# Patient Record
Sex: Female | Born: 1965 | Race: White | Hispanic: No | Marital: Married | Smoking: Never smoker
Health system: Southern US, Community
[De-identification: ages and names within clinical notes are randomized; demographics above are authoritative.]

## PROBLEM LIST (undated history)

## (undated) DIAGNOSIS — C801 Malignant (primary) neoplasm, unspecified: Secondary | ICD-10-CM

## (undated) DIAGNOSIS — I10 Essential (primary) hypertension: Secondary | ICD-10-CM

## (undated) DIAGNOSIS — E785 Hyperlipidemia, unspecified: Secondary | ICD-10-CM

## (undated) DIAGNOSIS — N939 Abnormal uterine and vaginal bleeding, unspecified: Secondary | ICD-10-CM

## (undated) DIAGNOSIS — D649 Anemia, unspecified: Secondary | ICD-10-CM

## (undated) DIAGNOSIS — N840 Polyp of corpus uteri: Secondary | ICD-10-CM

## (undated) HISTORY — DX: Essential (primary) hypertension: I10

## (undated) HISTORY — PX: BREAST SURGERY: SHX581

---

## 2001-11-08 ENCOUNTER — Other Ambulatory Visit: Admission: RE | Admit: 2001-11-08 | Discharge: 2001-11-08 | Payer: Self-pay | Admitting: Gynecology

## 2010-04-18 ENCOUNTER — Encounter: Payer: Self-pay | Admitting: General Surgery

## 2012-04-18 ENCOUNTER — Other Ambulatory Visit (HOSPITAL_COMMUNITY): Payer: Self-pay | Admitting: General Surgery

## 2012-04-18 DIAGNOSIS — N6320 Unspecified lump in the left breast, unspecified quadrant: Secondary | ICD-10-CM

## 2012-04-18 DIAGNOSIS — N6310 Unspecified lump in the right breast, unspecified quadrant: Secondary | ICD-10-CM

## 2012-04-25 ENCOUNTER — Other Ambulatory Visit (HOSPITAL_COMMUNITY): Payer: Self-pay | Admitting: General Surgery

## 2012-04-25 ENCOUNTER — Ambulatory Visit (HOSPITAL_COMMUNITY)
Admission: RE | Admit: 2012-04-25 | Discharge: 2012-04-25 | Disposition: A | Discharge status: Home / Self Care | Payer: Self-pay | Source: Ambulatory Visit | Attending: General Surgery | Admitting: General Surgery

## 2012-04-25 DIAGNOSIS — N6310 Unspecified lump in the right breast, unspecified quadrant: Secondary | ICD-10-CM

## 2012-04-25 DIAGNOSIS — N6320 Unspecified lump in the left breast, unspecified quadrant: Secondary | ICD-10-CM

## 2012-04-25 DIAGNOSIS — N63 Unspecified lump in unspecified breast: Secondary | ICD-10-CM | POA: Insufficient documentation

## 2012-04-25 DIAGNOSIS — N6009 Solitary cyst of unspecified breast: Secondary | ICD-10-CM | POA: Insufficient documentation

## 2012-06-20 ENCOUNTER — Other Ambulatory Visit (HOSPITAL_COMMUNITY)
Admission: RE | Admit: 2012-06-20 | Discharge: 2012-06-20 | Disposition: A | Discharge status: Home / Self Care | Payer: Self-pay | Source: Ambulatory Visit | Attending: General Surgery | Admitting: General Surgery

## 2012-06-20 ENCOUNTER — Other Ambulatory Visit: Payer: Self-pay | Admitting: General Surgery

## 2012-06-20 DIAGNOSIS — N6009 Solitary cyst of unspecified breast: Secondary | ICD-10-CM | POA: Insufficient documentation

## 2012-08-21 ENCOUNTER — Other Ambulatory Visit (HOSPITAL_COMMUNITY)
Admission: RE | Admit: 2012-08-21 | Discharge: 2012-08-21 | Disposition: A | Discharge status: Home / Self Care | Payer: Self-pay | Source: Ambulatory Visit | Attending: General Surgery | Admitting: General Surgery

## 2012-08-21 DIAGNOSIS — N6009 Solitary cyst of unspecified breast: Secondary | ICD-10-CM | POA: Insufficient documentation

## 2014-06-03 ENCOUNTER — Other Ambulatory Visit (HOSPITAL_COMMUNITY): Payer: Self-pay | Admitting: General Surgery

## 2014-06-03 DIAGNOSIS — R229 Localized swelling, mass and lump, unspecified: Principal | ICD-10-CM

## 2014-06-03 DIAGNOSIS — IMO0002 Reserved for concepts with insufficient information to code with codable children: Secondary | ICD-10-CM

## 2014-06-17 ENCOUNTER — Ambulatory Visit (HOSPITAL_COMMUNITY)
Admission: RE | Admit: 2014-06-17 | Discharge: 2014-06-17 | Disposition: A | Discharge status: Home / Self Care | Payer: Self-pay | Source: Ambulatory Visit | Attending: General Surgery | Admitting: General Surgery

## 2014-06-17 ENCOUNTER — Other Ambulatory Visit (HOSPITAL_COMMUNITY): Payer: Self-pay | Admitting: General Surgery

## 2014-06-17 DIAGNOSIS — IMO0002 Reserved for concepts with insufficient information to code with codable children: Secondary | ICD-10-CM

## 2014-06-17 DIAGNOSIS — R229 Localized swelling, mass and lump, unspecified: Principal | ICD-10-CM

## 2014-06-17 DIAGNOSIS — N63 Unspecified lump in breast: Secondary | ICD-10-CM | POA: Insufficient documentation

## 2014-11-03 ENCOUNTER — Ambulatory Visit (HOSPITAL_COMMUNITY)
Admission: RE | Admit: 2014-11-03 | Discharge: 2014-11-03 | Disposition: A | Discharge status: Home / Self Care | Payer: Self-pay | Source: Ambulatory Visit | Attending: Obstetrics and Gynecology | Admitting: Obstetrics and Gynecology

## 2014-11-03 ENCOUNTER — Encounter (HOSPITAL_COMMUNITY): Payer: Self-pay

## 2014-11-03 VITALS — BP 132/84 | Temp 97.4°F | Ht 62.0 in | Wt 172.0 lb

## 2014-11-03 DIAGNOSIS — Z01419 Encounter for gynecological examination (general) (routine) without abnormal findings: Secondary | ICD-10-CM

## 2014-11-03 HISTORY — DX: Anemia, unspecified: D64.9

## 2014-11-03 NOTE — Progress Notes (Signed)
CLINIC:   Breast & Cervical Cancer Control Program (BCCCP) Clinic  REASON FOR VISIT: Well-woman exam with routine gynecological exam  HISTORY OF PRESENT ILLNESS:   Ms. Bonnie Dorsey is a 49 y.o. female who presents to the [facility] today for clinical breast exam and routine gynecological exam.  History obtained through interpreter. She reports bilateral nodularity that comes and goes with her periods. Denies discrete masses. Her last mammogram was in March 2016 and was benign.  Her last pap smear was 10 years ago and was normal. She has no history of abnormal pap smears.    REVIEW OF SYSTEMS:   Reports irregular periods. Denies breast pain, nodularity, skin changes, nipple inversion, or nipple discharge bilaterally.  Denies any pelvic pain, pressure.    ALLERGIES: No Known Allergies   CURRENT MEDICATIONS:   No current outpatient prescriptions on file prior to encounter.   No current facility-administered medications on file prior to encounter.   PHYSICAL EXAM:   Filed Vitals:   11/03/14 1124  BP: 132/84  Temp: 97.4 F (36.3 C)   General: Well-nourished, well-appearing female in no acute distress.  She is accompanied by an interpreter in clinic today.  Bonnie Infante, LPN was present during physical exam for this patient.   Breasts: Bilateral breasts exposed and observed with patient standing (arms at side, arms on hips, arms on hips flexed forward, and arms over head).  No gross abnormalities including breast skin puckering or dimpling noted on observation.  Breasts symmetrical without evidence of skin redness, thickening, or peau d'orange appearance. No nipple retraction or nipple discharge noted bilaterally.  No breast nodularity palpated in bilateral breasts.  Axillary lymph nodes: No axillary lymphadenopathy bilaterally.    GU:   -External genitalia: No lesions, swelling, or discharge. Even hair distribution as expected.   -Vagina: Pink, moist. Difficulty visualizing the vaginal  canal due to blood.   -Cervix: Cervix pink. Cervical os patent. Blood noted.   -Uterus: Bimanual exam demonstrates no uterine mass or tenderness on palpation. Uterus in normal position and normal size.   -Adnexae: Bimanual exam demonstrates no ovarian masses or tenderness on palpation.   -Rectovaginal: No lesions noted to rectum. Rectal tone intact.  No masses or nodularity palpated by bimanual rectovaginal exam.     ASSESSMENT & PLAN:   1. Breast cancer screening: Ms. Pizano has no palpable breast abnormalities on her clinical breast exam today.  She will have a repeat screening mammogram in March 2017.  She was given instructions and educational materials regarding breast self-awareness. Ms. Nolting is aware of this plan and agrees with it.   2. Cervical cancer screening: Ms. Yurick had a lot of blood due to her period on exam today.  A pap smear was completed today per protocol.  She tolerated the procedure without complaints.  She will be contacted by one of our [place] in the next few weeks to review the results of the pap smear with the patient.    Ms. Hecker was encouraged to ask questions and all questions were answered to her satisfaction.      Bonnie Bussing, DNP, AGPCNP-BC, [place]   [phone]

## 2014-11-04 LAB — CYTOLOGY - PAP

## 2014-11-06 ENCOUNTER — Other Ambulatory Visit (HOSPITAL_COMMUNITY): Payer: Self-pay | Admitting: *Deleted

## 2014-11-06 ENCOUNTER — Telehealth (HOSPITAL_COMMUNITY): Payer: Self-pay | Admitting: *Deleted

## 2014-11-06 NOTE — Telephone Encounter (Signed)
Telephoned patient at home # and no voice mail box set up. Used interpreter Lavon Paganini.

## 2014-11-07 ENCOUNTER — Telehealth (HOSPITAL_COMMUNITY): Payer: Self-pay | Admitting: *Deleted

## 2014-11-07 NOTE — Telephone Encounter (Signed)
Patient returned call and advised patient of normal pap smear results. Negative HPV results. Next pap smear due in 5 years. Patient voiced understanding. Used interpreter Lavon Paganini.

## 2014-11-13 ENCOUNTER — Other Ambulatory Visit: Payer: Self-pay

## 2014-11-13 ENCOUNTER — Ambulatory Visit (HOSPITAL_BASED_OUTPATIENT_CLINIC_OR_DEPARTMENT_OTHER): Payer: Self-pay

## 2014-11-13 VITALS — BP 120/80 | HR 78 | Temp 98.3°F | Resp 14 | Ht <= 58 in | Wt 173.3 lb

## 2014-11-13 DIAGNOSIS — Z0000 Encounter for general adult medical examination without abnormal findings: Secondary | ICD-10-CM

## 2014-11-13 LAB — LIPID PANEL
CHOL/HDL RATIO: 4.1 ratio (ref <=5.0)
Cholesterol: 134 mg/dL (ref 125–200)
HDL: 33 mg/dL — AB (ref >=46)
LDL Cholesterol: 50 mg/dL (ref <130)
Triglycerides: 254 mg/dL — ABNORMAL HIGH (ref <150)
VLDL: 51 mg/dL — ABNORMAL HIGH (ref <30)

## 2014-11-13 LAB — HEMOGLOBIN A1C
Hgb A1c MFr Bld: 5.3 % (ref <5.7)
MEAN PLASMA GLUCOSE: 105 mg/dL (ref <117)

## 2014-11-13 LAB — GLUCOSE (CC13): Glucose: 97 mg/dl (ref 70–140)

## 2014-11-13 NOTE — Patient Instructions (Signed)
Discussed what will result from lab work and vital signs. Will write lab results on number sheet when results called. Verbalized understanding.

## 2014-11-13 NOTE — Progress Notes (Signed)
Patient is a new patient to the [facility] program and is currently a BCCCP patient effective 11/03/2014 with interpreter Lavon Paganini.   Clinical Measurements: Patient is 4 ft. 10 inches, weight 173.3 lbs.   Medical History: Patient has no history of high cholesterol. Patient does not have a history of hypertension or diabetes. Per patient no diagnosed history of coronary heart disease, heart attack, heart failure, stroke/TIA, vascular disease or congenital heart defects.   Blood Pressure, Self-measurement: Patient states has no reason to check Blood pressure.  Nutrition Assessment: Patient stated that eats 1 fruit every day. Patient states she eats 1 serving of vegetables a day. Per patient states does eat 3 or more ounces of whole grains daily. Patient stated doesn't eat two or more servings of fish weekly. Patient states she does not drink more than 36 ounces or 450 calories of beverages with added sugars weekly. Patient stated she does not watch her salt intake. Marland Kitchen  Physical Activity Assessment: Patient stated that takes care of two small children and does not do much exercise. Per patient states rides stationary bike about 15 minutes twice a day and does domestic chores for around 150 minutes a week and does not do any vigorous exercise.  Smoking Status: Patient has never smoked and is not exposed to smoke.  Quality of Life Assessment: In assessing patient's quality of life she stated that out of the past 30 days that she has felt her health is good all of them. Patient also stated that in the past 30 days that her mental health was good including stress, depression and problems with emotions for all days. Patient did state that out of the past 30 days she felt her physical or mental health had not kept her from doing her usual activities including self-care, work or recreation.   Plan: Lab work will be done today including a lipid panel, blood glucose, and Hgb A1C. Will call lab results when  they are finished. Will discuss risk reduction counseling when call results.

## 2015-01-01 ENCOUNTER — Telehealth: Payer: Self-pay

## 2015-01-01 NOTE — Telephone Encounter (Addendum)
Patient stated that had lost 3 lbs and was following information she was given at risk reduction counseling and does not want health coaching.

## 2015-02-11 NOTE — Addendum Note (Signed)
Encounter addended by: Loletta Parish, RN on: 02/11/2015  4:47 PM<BR>     Documentation filed: Charges VN

## 2017-01-02 ENCOUNTER — Encounter (HOSPITAL_COMMUNITY): Payer: Self-pay

## 2017-09-04 ENCOUNTER — Encounter (HOSPITAL_COMMUNITY): Payer: Self-pay

## 2017-09-07 ENCOUNTER — Encounter (HOSPITAL_COMMUNITY): Payer: Self-pay

## 2017-09-07 ENCOUNTER — Ambulatory Visit (HOSPITAL_COMMUNITY)
Admission: RE | Admit: 2017-09-07 | Discharge: 2017-09-07 | Disposition: A | Discharge status: Home / Self Care | Payer: Self-pay | Source: Ambulatory Visit | Attending: Obstetrics and Gynecology | Admitting: Obstetrics and Gynecology

## 2017-09-07 VITALS — BP 112/70

## 2017-09-07 DIAGNOSIS — Z1239 Encounter for other screening for malignant neoplasm of breast: Secondary | ICD-10-CM

## 2017-09-07 DIAGNOSIS — N6310 Unspecified lump in the right breast, unspecified quadrant: Secondary | ICD-10-CM

## 2017-09-07 DIAGNOSIS — N6320 Unspecified lump in the left breast, unspecified quadrant: Secondary | ICD-10-CM

## 2017-09-07 HISTORY — DX: Hyperlipidemia, unspecified: E78.5

## 2017-09-07 NOTE — Progress Notes (Signed)
Patient referred to BCCCP by [facility] due to recommending a right breast biopsy. Diagnostic mammogram and right breast ultrasound completed 09/04/2017.  Patient complained of a left breast lump x 2 months that is painful to the touch. Patient rates the pain at a 3 out of 10.  Patient complained of a cluster of lumps within the right breast x a few weeks and pain at times over the past month. Patient rates the pain at a 4 out of 10.  Pap Smear: Pap smear not completed today. Last Pap smear was 11/03/2014 at [facility] and normal with endometrial cells present consistent with menstrual period and negative HPV. Per patient has no history of an abnormal Pap smear. Last Pap smear result is in Epic.  Physical exam: Breasts  Breasts symmetrical. No skin abnormalities bilateral breasts. No nipple retraction bilateral breasts. No nipple discharge bilateral breasts. No lymphadenopathy. Palpated a lump within the left breast under areola. Palpated a cluster of lumps under the right breast areola. Complaints of tenderness when palpated lumps. Referred patient to [facility] for a right breast biopsy per recommendation. Appointment scheduled for Tuesday, September 12, 2017 at 0830.        Pelvic/Bimanual No Pap smear completed today since last Pap smear and HPV typing was 11/03/2014. Pap smear not indicated per BCCCP guidelines.   Smoking History: Patient has never smoked.  Patient Navigation: Patient education provided. Access to services provided for patient through [facility] program. Spanish interpreter provided.   Colorectal Cancer Screening: Per patient has never had a colonoscopy completed. No complaints today. FIT Test given to patient to complete and return to BCCCP.  Breast and Cervical Cancer Risk Assessment: Patient has a family history of her sister having breast cancer. Patient has no known genetic mutations or history of radiation treatment to the chest before age 65. Patient has  no history of cervical dysplasia, immunocompromised, or DES exposure in-utero. Patient has a 5-year risk for breast cancer at 1.3% and a lifetime risk at 10.1%.  Used Spanish interpreter [employer] from [place].

## 2017-09-07 NOTE — Patient Instructions (Signed)
Explained breast self awareness with Bonnie Dorsey. Patient did not need a Pap smear today due to last Pap smear and HPV typing was 11/03/2014. Let her know BCCCP will cover Pap smears and HPV typing every 5 years unless has a history of abnormal Pap smears. Referred patient to [facility] for a right breast biopsy per recommendation. Appointment scheduled for Tuesday, September 12, 2017 at 0830. Bonnie Dorsey verbalized understanding.  Gean Larose, Arvil Chaco, RN 3:26 PM

## 2017-09-12 ENCOUNTER — Other Ambulatory Visit: Payer: Self-pay | Admitting: Radiology

## 2017-09-15 ENCOUNTER — Encounter (HOSPITAL_COMMUNITY): Payer: Self-pay | Admitting: *Deleted

## 2017-09-20 ENCOUNTER — Other Ambulatory Visit: Payer: Self-pay | Admitting: Radiology

## 2017-10-03 ENCOUNTER — Encounter (HOSPITAL_COMMUNITY): Payer: Self-pay | Admitting: *Deleted

## 2017-10-04 ENCOUNTER — Other Ambulatory Visit: Payer: Self-pay | Admitting: General Surgery

## 2017-10-04 DIAGNOSIS — N6091 Unspecified benign mammary dysplasia of right breast: Secondary | ICD-10-CM

## 2017-10-04 DIAGNOSIS — D0501 Lobular carcinoma in situ of right breast: Secondary | ICD-10-CM

## 2017-10-11 ENCOUNTER — Other Ambulatory Visit: Payer: Self-pay

## 2017-10-11 ENCOUNTER — Encounter (HOSPITAL_BASED_OUTPATIENT_CLINIC_OR_DEPARTMENT_OTHER): Payer: Self-pay | Admitting: *Deleted

## 2017-10-16 ENCOUNTER — Encounter (HOSPITAL_BASED_OUTPATIENT_CLINIC_OR_DEPARTMENT_OTHER): Payer: Self-pay | Admitting: *Deleted

## 2017-10-16 NOTE — Progress Notes (Signed)
Ensure pre surgery drink given with instructions to complete by 0530 dos, surgical soap given with instructions, pt verbalized understanding. 

## 2017-10-17 ENCOUNTER — Ambulatory Visit (HOSPITAL_BASED_OUTPATIENT_CLINIC_OR_DEPARTMENT_OTHER)
Admission: RE | Admit: 2017-10-17 | Discharge: 2017-10-17 | Disposition: A | Discharge status: Home / Self Care | Payer: Self-pay | Source: Ambulatory Visit | Attending: General Surgery | Admitting: General Surgery

## 2017-10-17 ENCOUNTER — Ambulatory Visit (HOSPITAL_BASED_OUTPATIENT_CLINIC_OR_DEPARTMENT_OTHER): Payer: Self-pay | Admitting: Anesthesiology

## 2017-10-17 ENCOUNTER — Encounter (HOSPITAL_BASED_OUTPATIENT_CLINIC_OR_DEPARTMENT_OTHER): Payer: Self-pay

## 2017-10-17 ENCOUNTER — Encounter (HOSPITAL_BASED_OUTPATIENT_CLINIC_OR_DEPARTMENT_OTHER)
Admission: RE | Disposition: A | Discharge status: Home / Self Care | Payer: Self-pay | Source: Ambulatory Visit | Attending: General Surgery

## 2017-10-17 ENCOUNTER — Other Ambulatory Visit: Payer: Self-pay

## 2017-10-17 DIAGNOSIS — Z79899 Other long term (current) drug therapy: Secondary | ICD-10-CM | POA: Insufficient documentation

## 2017-10-17 DIAGNOSIS — E7800 Pure hypercholesterolemia, unspecified: Secondary | ICD-10-CM | POA: Insufficient documentation

## 2017-10-17 DIAGNOSIS — N6091 Unspecified benign mammary dysplasia of right breast: Secondary | ICD-10-CM | POA: Insufficient documentation

## 2017-10-17 DIAGNOSIS — D0501 Lobular carcinoma in situ of right breast: Secondary | ICD-10-CM | POA: Insufficient documentation

## 2017-10-17 HISTORY — DX: Malignant (primary) neoplasm, unspecified: C80.1

## 2017-10-17 HISTORY — PX: RADIOACTIVE SEED GUIDED EXCISIONAL BREAST BIOPSY: SHX6490

## 2017-10-17 LAB — POCT PREGNANCY, URINE: Preg Test, Ur: NEGATIVE

## 2017-10-17 SURGERY — RADIOACTIVE SEED GUIDED BREAST BIOPSY
Anesthesia: General | Site: Breast | Laterality: Right

## 2017-10-17 MED ORDER — PROPOFOL 10 MG/ML IV BOLUS: INTRAVENOUS | Status: DC | PRN

## 2017-10-17 MED ORDER — EPHEDRINE SULFATE 50 MG/ML IJ SOLN
INTRAMUSCULAR | Status: DC | PRN
  Administered 2017-10-17 (×2): 10 mg via INTRAVENOUS

## 2017-10-17 MED ORDER — DEXAMETHASONE SODIUM PHOSPHATE 10 MG/ML IJ SOLN
INTRAMUSCULAR | Status: AC
  Filled 2017-10-17: qty 1

## 2017-10-17 MED ORDER — CEFAZOLIN SODIUM-DEXTROSE 2-4 GM/100ML-% IV SOLN
2.0000 g | INTRAVENOUS | Status: AC
  Administered 2017-10-17: 2 g via INTRAVENOUS

## 2017-10-17 MED ORDER — MIDAZOLAM HCL 2 MG/2ML IJ SOLN
INTRAMUSCULAR | Status: AC
  Filled 2017-10-17: qty 2

## 2017-10-17 MED ORDER — ONDANSETRON HCL 4 MG/2ML IJ SOLN
INTRAMUSCULAR | Status: DC | PRN
  Administered 2017-10-17: 4 mg via INTRAVENOUS

## 2017-10-17 MED ORDER — CEFAZOLIN SODIUM-DEXTROSE 2-4 GM/100ML-% IV SOLN
INTRAVENOUS | Status: AC
Start: 2017-10-17 — End: ?
  Filled 2017-10-17: qty 100

## 2017-10-17 MED ORDER — ONDANSETRON HCL 4 MG/2ML IJ SOLN
INTRAMUSCULAR | Status: AC
  Filled 2017-10-17: qty 2

## 2017-10-17 MED ORDER — LACTATED RINGERS IV SOLN
INTRAVENOUS | Status: DC
  Administered 2017-10-17 (×2): via INTRAVENOUS

## 2017-10-17 MED ORDER — TRAMADOL HCL 50 MG PO TABS: 50.0000 mg | ORAL_TABLET | Freq: Four times a day (QID) | ORAL | 0 refills | Status: DC | PRN

## 2017-10-17 MED ORDER — SCOPOLAMINE 1 MG/3DAYS TD PT72: 1.0000 | MEDICATED_PATCH | Freq: Once | TRANSDERMAL | Status: DC | PRN

## 2017-10-17 MED ORDER — CHLORHEXIDINE GLUCONATE CLOTH 2 % EX PADS: 6.0000 | MEDICATED_PAD | Freq: Once | CUTANEOUS | Status: DC

## 2017-10-17 MED ORDER — GABAPENTIN 100 MG PO CAPS
100.0000 mg | ORAL_CAPSULE | ORAL | Status: AC
  Administered 2017-10-17: 100 mg via ORAL

## 2017-10-17 MED ORDER — BUPIVACAINE HCL (PF) 0.25 % IJ SOLN
INTRAMUSCULAR | Status: AC
  Filled 2017-10-17: qty 30

## 2017-10-17 MED ORDER — ACETAMINOPHEN 500 MG PO TABS
1000.0000 mg | ORAL_TABLET | ORAL | Status: AC
  Administered 2017-10-17: 1000 mg via ORAL

## 2017-10-17 MED ORDER — FENTANYL CITRATE (PF) 100 MCG/2ML IJ SOLN
INTRAMUSCULAR | Status: AC
  Filled 2017-10-17: qty 2

## 2017-10-17 MED ORDER — CELECOXIB 200 MG PO CAPS
ORAL_CAPSULE | ORAL | Status: AC
Start: 2017-10-17 — End: ?
  Filled 2017-10-17: qty 2

## 2017-10-17 MED ORDER — FENTANYL CITRATE (PF) 100 MCG/2ML IJ SOLN
50.0000 ug | INTRAMUSCULAR | Status: DC | PRN
  Administered 2017-10-17: 100 ug via INTRAVENOUS

## 2017-10-17 MED ORDER — BUPIVACAINE HCL (PF) 0.25 % IJ SOLN
INTRAMUSCULAR | Status: DC | PRN
  Administered 2017-10-17: 8 mL

## 2017-10-17 MED ORDER — OXYCODONE HCL 5 MG/5ML PO SOLN: 5.0000 mg | Freq: Once | ORAL | Status: DC | PRN

## 2017-10-17 MED ORDER — LIDOCAINE HCL (CARDIAC) PF 100 MG/5ML IV SOSY
PREFILLED_SYRINGE | INTRAVENOUS | Status: AC
  Filled 2017-10-17: qty 5

## 2017-10-17 MED ORDER — LIDOCAINE HCL (CARDIAC) PF 100 MG/5ML IV SOSY
PREFILLED_SYRINGE | INTRAVENOUS | Status: DC | PRN
  Administered 2017-10-17: 70 mg via INTRAVENOUS

## 2017-10-17 MED ORDER — DEXAMETHASONE SODIUM PHOSPHATE 4 MG/ML IJ SOLN
INTRAMUSCULAR | Status: DC | PRN
  Administered 2017-10-17: 10 mg via INTRAVENOUS

## 2017-10-17 MED ORDER — ENSURE PRE-SURGERY PO LIQD: 296.0000 mL | Freq: Once | ORAL | Status: DC

## 2017-10-17 MED ORDER — CELECOXIB 400 MG PO CAPS
400.0000 mg | ORAL_CAPSULE | ORAL | Status: AC
  Administered 2017-10-17: 400 mg via ORAL

## 2017-10-17 MED ORDER — OXYCODONE HCL 5 MG PO TABS: 5.0000 mg | ORAL_TABLET | Freq: Once | ORAL | Status: DC | PRN

## 2017-10-17 MED ORDER — FENTANYL CITRATE (PF) 100 MCG/2ML IJ SOLN
25.0000 ug | INTRAMUSCULAR | Status: DC | PRN
Start: 2017-10-17 — End: 2017-10-17

## 2017-10-17 MED ORDER — ONDANSETRON HCL 4 MG/2ML IJ SOLN: 4.0000 mg | Freq: Four times a day (QID) | INTRAMUSCULAR | Status: DC | PRN

## 2017-10-17 MED ORDER — GABAPENTIN 100 MG PO CAPS
ORAL_CAPSULE | ORAL | Status: AC
  Filled 2017-10-17: qty 1

## 2017-10-17 MED ORDER — PROPOFOL 10 MG/ML IV BOLUS
INTRAVENOUS | Status: AC
  Filled 2017-10-17: qty 20

## 2017-10-17 MED ORDER — ACETAMINOPHEN 500 MG PO TABS
ORAL_TABLET | ORAL | Status: AC
  Filled 2017-10-17: qty 2

## 2017-10-17 MED ORDER — MIDAZOLAM HCL 2 MG/2ML IJ SOLN
1.0000 mg | INTRAMUSCULAR | Status: DC | PRN
  Administered 2017-10-17: 2 mg via INTRAVENOUS

## 2017-10-17 MED ORDER — PROPOFOL 10 MG/ML IV BOLUS
INTRAVENOUS | Status: DC | PRN
  Administered 2017-10-17: 180 mg via INTRAVENOUS

## 2017-10-17 SURGICAL SUPPLY — 62 items
ADH SKN CLS APL DERMABOND .7 (GAUZE/BANDAGES/DRESSINGS) ×1
APPLIER CLIP 9.375 MED OPEN (MISCELLANEOUS)
APR CLP MED 9.3 20 MLT OPN (MISCELLANEOUS)
BINDER BREAST LRG (GAUZE/BANDAGES/DRESSINGS) IMPLANT
BINDER BREAST MEDIUM (GAUZE/BANDAGES/DRESSINGS) IMPLANT
BINDER BREAST XLRG (GAUZE/BANDAGES/DRESSINGS) ×2 IMPLANT
BINDER BREAST XXLRG (GAUZE/BANDAGES/DRESSINGS) IMPLANT
BLADE SURG 15 STRL LF DISP TIS (BLADE) ×1 IMPLANT
BLADE SURG 15 STRL SS (BLADE) ×3
CANISTER SUC SOCK COL 7IN (MISCELLANEOUS) IMPLANT
CANISTER SUCT 1200ML W/VALVE (MISCELLANEOUS) IMPLANT
CHLORAPREP W/TINT 26ML (MISCELLANEOUS) ×3 IMPLANT
CLIP APPLIE 9.375 MED OPEN (MISCELLANEOUS) IMPLANT
CLIP VESOCCLUDE SM WIDE 6/CT (CLIP) ×2 IMPLANT
CLOSURE WOUND 1/2 X4 (GAUZE/BANDAGES/DRESSINGS) ×1
COVER BACK TABLE 60X90IN (DRAPES) ×3 IMPLANT
COVER MAYO STAND STRL (DRAPES) ×3 IMPLANT
COVER PROBE W GEL 5X96 (DRAPES) ×3 IMPLANT
DECANTER SPIKE VIAL GLASS SM (MISCELLANEOUS) IMPLANT
DERMABOND ADVANCED (GAUZE/BANDAGES/DRESSINGS) ×2
DERMABOND ADVANCED .7 DNX12 (GAUZE/BANDAGES/DRESSINGS) ×1 IMPLANT
DEVICE DUBIN W/COMP PLATE 8390 (MISCELLANEOUS) ×3 IMPLANT
DRAPE LAPAROSCOPIC ABDOMINAL (DRAPES) ×3 IMPLANT
DRAPE UTILITY XL STRL (DRAPES) ×3 IMPLANT
DRSG TEGADERM 4X4.75 (GAUZE/BANDAGES/DRESSINGS) IMPLANT
ELECT COATED BLADE 2.86 ST (ELECTRODE) ×3 IMPLANT
ELECT REM PT RETURN 9FT ADLT (ELECTROSURGICAL) ×3
ELECTRODE REM PT RTRN 9FT ADLT (ELECTROSURGICAL) ×1 IMPLANT
GAUZE SPONGE 4X4 12PLY STRL LF (GAUZE/BANDAGES/DRESSINGS) IMPLANT
GLOVE BIO SURGEON STRL SZ 6.5 (GLOVE) ×1 IMPLANT
GLOVE BIO SURGEON STRL SZ7 (GLOVE) ×6 IMPLANT
GLOVE BIO SURGEONS STRL SZ 6.5 (GLOVE) ×1
GLOVE BIOGEL PI IND STRL 7.5 (GLOVE) ×1 IMPLANT
GLOVE BIOGEL PI INDICATOR 7.5 (GLOVE) ×2
GLOVE EXAM NITRILE MD LF STRL (GLOVE) ×2 IMPLANT
GOWN STRL REUS W/ TWL LRG LVL3 (GOWN DISPOSABLE) ×2 IMPLANT
GOWN STRL REUS W/TWL LRG LVL3 (GOWN DISPOSABLE) ×6
HEMOSTAT ARISTA ABSORB 3G PWDR (MISCELLANEOUS) IMPLANT
ILLUMINATOR WAVEGUIDE N/F (MISCELLANEOUS) IMPLANT
KIT MARKER MARGIN INK (KITS) ×3 IMPLANT
LIGHT WAVEGUIDE WIDE FLAT (MISCELLANEOUS) IMPLANT
NDL HYPO 25X1 1.5 SAFETY (NEEDLE) ×1 IMPLANT
NEEDLE HYPO 25X1 1.5 SAFETY (NEEDLE) ×3 IMPLANT
NS IRRIG 1000ML POUR BTL (IV SOLUTION) ×2 IMPLANT
PACK BASIN DAY SURGERY FS (CUSTOM PROCEDURE TRAY) ×3 IMPLANT
PENCIL BUTTON HOLSTER BLD 10FT (ELECTRODE) ×3 IMPLANT
SLEEVE SCD COMPRESS KNEE MED (MISCELLANEOUS) ×3 IMPLANT
SPONGE LAP 4X18 RFD (DISPOSABLE) ×3 IMPLANT
STRIP CLOSURE SKIN 1/2X4 (GAUZE/BANDAGES/DRESSINGS) ×2 IMPLANT
SUT MNCRL AB 4-0 PS2 18 (SUTURE) ×2 IMPLANT
SUT MON AB 5-0 PS2 18 (SUTURE) IMPLANT
SUT SILK 2 0 SH (SUTURE) IMPLANT
SUT VIC AB 2-0 SH 27 (SUTURE) ×3
SUT VIC AB 2-0 SH 27XBRD (SUTURE) ×1 IMPLANT
SUT VIC AB 3-0 SH 27 (SUTURE) ×3
SUT VIC AB 3-0 SH 27X BRD (SUTURE) ×1 IMPLANT
SYR CONTROL 10ML LL (SYRINGE) ×3 IMPLANT
TOWEL GREEN STERILE FF (TOWEL DISPOSABLE) ×3 IMPLANT
TOWEL OR NON WOVEN STRL DISP B (DISPOSABLE) ×1 IMPLANT
TUBE CONNECTING 20'X1/4 (TUBING)
TUBE CONNECTING 20X1/4 (TUBING) IMPLANT
YANKAUER SUCT BULB TIP NO VENT (SUCTIONS) IMPLANT

## 2017-10-17 NOTE — H&P (Signed)
52 yof referred by Dr Luan Pulling after multiple radiologic biopsies. she had no mass or dc. this is done via her daughter interpreting. they declined medical interpreter. no prior history except for benign cyst removed in [place]. screening mm shows b density breasts. on the right side there are three groups of calcs and on left side there is cluster of masses. US shows numerous simple cysts bilaterally. both axillae are negative. she had core biopsy of 3 separate areas on the right side. posterior at 9 there is lcis, uiq is fcc and concordant and uoq is adh. she is here to discuss options.    Past Surgical History Illene Regulus, CMA; 10/04/2017 9:46 AM) Breast Biopsy  Bilateral.  Diagnostic Studies History Lars Mage Spillers, CMA; 10/04/2017 9:46 AM) Colonoscopy  never Mammogram  within last year Pap Smear  1-5 years ago  Allergies Illene Regulus, CMA; 10/04/2017 9:47 AM) No Known Drug Allergies [10/04/2017]:  Medication History (Alisha Spillers, CMA; 10/04/2017 9:48 AM) Simvastatin (20MG  Tablet, Oral) Active. Medications Reconciled  Social History Illene Regulus, CMA; 10/04/2017 9:46 AM) Caffeine use  Coffee. No alcohol use  Tobacco use  Never smoker.  Family History Illene Regulus, [place]; 10/04/2017 9:46 AM) Alcohol Abuse  Father, Sister. Breast Cancer  Sister. Colon Cancer  Family Members In General. Colon Polyps  Family Members In General. Diabetes Mellitus  Family Members In General, Sister. Heart disease in female family member before age 90  Hypertension  Sister. Kidney Disease  Mother. Rectal Cancer  Family Members In General.  Pregnancy / Birth History Illene Regulus, [place]; 10/04/2017 9:46 AM) Age at menarche  4 years. Age of menopause  36-55 Gravida  2 Irregular periods  Length (months) of breastfeeding  >24 Maternal age  8-20 Para  1  Other Problems Illene Regulus, CMA; 10/04/2017 9:46 AM) Back Pain  Hypercholesterolemia   Lump In Breast     Review of Systems (Alisha Spillers CMA; 10/04/2017 9:46 AM) General Not Present- Appetite Loss, Chills, Fatigue, Fever, Night Sweats, Weight Gain and Weight Loss. Breast Present- Breast Mass. Not Present- Breast Pain, Nipple Discharge and Skin Changes. Cardiovascular Not Present- Chest Pain, Difficulty Breathing Lying Down, Leg Cramps, Palpitations, Rapid Heart Rate, Shortness of Breath and Swelling of Extremities. Musculoskeletal Present- Joint Pain. Not Present- Back Pain, Joint Stiffness, Muscle Pain, Muscle Weakness and Swelling of Extremities. Neurological Present- Headaches and Weakness. Not Present- Decreased Memory, Fainting, Numbness, Seizures, Tingling, Tremor and Trouble walking. Psychiatric Present- Change in Sleep Pattern. Not Present- Anxiety, Bipolar, Depression, Fearful and Frequent crying. Endocrine Present- Hair Changes and Heat Intolerance. Not Present- Cold Intolerance, Excessive Hunger, Hot flashes and New Diabetes. Hematology Present- Excessive bleeding. Not Present- Blood Thinners, Easy Bruising, Gland problems, HIV and Persistent Infections.  Vitals (Alisha Spillers CMA; 10/04/2017 9:47 AM) 10/04/2017 9:47 AM Weight: 173 lb Height: 61in Body Surface Area: 1.78 m Body Mass Index: 32.69 kg/m  Pulse: 70 (Regular)  BP: 110/70 (Sitting, Left Arm, Standard)       Physical Exam Rolm Bookbinder MD; 10/04/2017 11:40 PM) General Mental Status-Alert.  Chest and Lung Exam Chest and lung exam reveals -quiet, even and easy respiratory effort with no use of accessory muscles and on auscultation, normal breath sounds, no adventitious sounds and normal vocal resonance.  Breast Nipples-No Discharge. Breast Lump-No Palpable Breast Mass. Note: several small right breast hematomas   Cardiovascular Cardiovascular examination reveals -normal heart sounds, regular rate and rhythm with no murmurs.  Neurologic Neurologic evaluation  reveals -alert and oriented x 3 with no impairment  of recent or remote memory.  Neuropsychiatric The patient's mood and affect are described as -normal.  Lymphatic Axillary  General Axillary Region: Bilateral - Description - Normal. 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 discussed possibility of upgrade. will place two seeds and excise both areas. the third area does not need excision. discussed surgery, recovery and risks.

## 2017-10-17 NOTE — Anesthesia Preprocedure Evaluation (Signed)
Anesthesia Evaluation  Patient identified by MRN, date of birth, ID band Patient awake    Reviewed: Allergy & Precautions, H&P , NPO status , Patient's Chart, lab work & pertinent test results  Airway Mallampati: II   Neck ROM: full    Dental   Pulmonary neg pulmonary ROS,    breath sounds clear to auscultation       Cardiovascular negative cardio ROS   Rhythm:regular Rate:Normal     Neuro/Psych    GI/Hepatic   Endo/Other    Renal/GU      Musculoskeletal   Abdominal   Peds  Hematology   Anesthesia Other Findings   Reproductive/Obstetrics Right breast CA                             Anesthesia Physical Anesthesia Plan  ASA: II  Anesthesia Plan: General   Post-op Pain Management:    Induction: Intravenous  PONV Risk Score and Plan: 3 and Ondansetron, Midazolam, Treatment may vary due to age or medical condition and Dexamethasone  Airway Management Planned: LMA  Additional Equipment:   Intra-op Plan:   Post-operative Plan:   Informed Consent: I have reviewed the patients History and Physical, chart, labs and discussed the procedure including the risks, benefits and alternatives for the proposed anesthesia with the patient or authorized representative who has indicated his/her understanding and acceptance.     Plan Discussed with: CRNA, Anesthesiologist and Surgeon  Anesthesia Plan Comments:         Anesthesia Quick Evaluation

## 2017-10-17 NOTE — Anesthesia Procedure Notes (Signed)
Procedure Name: LMA Insertion Date/Time: 10/17/2017 11:49 AM Performed by: Maryella Shivers, CRNA Pre-anesthesia Checklist: Patient identified, Emergency Drugs available, Suction available and Patient being monitored Patient Re-evaluated:Patient Re-evaluated prior to induction Oxygen Delivery Method: Circle system utilized Preoxygenation: Pre-oxygenation with 100% oxygen Induction Type: IV induction Ventilation: Mask ventilation without difficulty LMA: LMA inserted LMA Size: 4.0 Number of attempts: 1 Airway Equipment and Method: Bite block Placement Confirmation: positive ETCO2 Tube secured with: Tape Dental Injury: Teeth and Oropharynx as per pre-operative assessment

## 2017-10-17 NOTE — Discharge Instructions (Signed)
[facility] °Office Phone Number [phone] ° °BREAST BIOPSY/ PARTIAL MASTECTOMY: POST OP INSTRUCTIONS ° °Always review your discharge instruction sheet given to you by the facility where your surgery was performed. ° °IF YOU HAVE DISABILITY OR FAMILY LEAVE FORMS, YOU MUST BRING THEM TO THE OFFICE FOR PROCESSING.  DO NOT GIVE THEM TO YOUR DOCTOR. ° °1. A prescription for pain medication may be given to you upon discharge.  Take your pain medication as prescribed, if needed.  If narcotic pain medicine is not needed, then you may take acetaminophen (Tylenol), naprosyn (Alleve) or ibuprofen (Advil) as needed. °2. Take your usually prescribed medications unless otherwise directed °3. If you need a refill on your pain medication, please contact your pharmacy.  They will contact our office to request authorization.  Prescriptions will not be filled after 5pm or on week-ends. °4. You should eat very light the first 24 hours after surgery, such as soup, crackers, pudding, etc.  Resume your normal diet the day after surgery. °5. Most patients will experience some swelling and bruising in the breast.  Ice packs and a good support bra will help.  Wear the breast binder provided or a sports bra for 72 hours day and night.  After that wear a sports bra during the day until you return to the office. Swelling and bruising can take several days to resolve.  °6. It is common to experience some constipation if taking pain medication after surgery.  Increasing fluid intake and taking a stool softener will usually help or prevent this problem from occurring.  A mild laxative (Milk of Magnesia or Miralax) should be taken according to package directions if there are no bowel movements after 48 hours. °7. Unless discharge instructions indicate otherwise, you may remove your bandages 48 hours after surgery and you may shower at that time.  You may have steri-strips (small skin tapes) in place directly over the incision.   These strips should be left on the skin for 7-10 days and will come off on their own.  If your surgeon used skin glue on the incision, you may shower in 24 hours.  The glue will flake off over the next 2-3 weeks.  Any sutures or staples will be removed at the office during your follow-up visit. °8. ACTIVITIES:  You may resume regular daily activities (gradually increasing) beginning the next day.  Wearing a good support bra or sports bra minimizes pain and swelling.  You may have sexual intercourse when it is comfortable. °a. You may drive when you no longer are taking prescription pain medication, you can comfortably wear a seatbelt, and you can safely maneuver your car and apply brakes. °b. RETURN TO WORK:  ______________________________________________________________________________________ °9. You should see your doctor in the office for a follow-up appointment approximately two weeks after your surgery.  Your doctor’s nurse will typically make your follow-up appointment when she calls you with your pathology report.  Expect your pathology report 3-4 business days after your surgery.  You may call to check if you do not hear from us after three days. °10. OTHER INSTRUCTIONS: _______________________________________________________________________________________________ _____________________________________________________________________________________________________________________________________ °_____________________________________________________________________________________________________________________________________ °_____________________________________________________________________________________________________________________________________ ° °WHEN TO CALL DR WAKEFIELD: °1. Fever over 101.0 °2. Nausea and/or vomiting. °3. Extreme swelling or bruising. °4. Continued bleeding from incision. °5. Increased pain, redness, or drainage from the incision. ° °The clinic staff is available to  answer your questions during regular business hours.  Please don’t hesitate to call and ask to speak to one of the nurses for   clinical concerns.  If you have a medical emergency, go to the nearest emergency room or call 911.  A surgeon from [facility] is always on call at the hospital.  For further questions, please visit [email or web address]    Tylenol 1,000mg  given at 9:45am.    Post Anesthesia Home Care Instructions  Activity: Get plenty of rest for the remainder of the day. A responsible individual must stay with you for 24 hours following the procedure.  For the next 24 hours, DO NOT: -Drive a car -Paediatric nurse -Drink alcoholic beverages -Take any medication unless instructed by your physician -Make any legal decisions or sign important papers.  Meals: Start with liquid foods such as gelatin or soup. Progress to regular foods as tolerated. Avoid greasy, spicy, heavy foods. If nausea and/or vomiting occur, drink only clear liquids until the nausea and/or vomiting subsides. Call your physician if vomiting continues.  Special Instructions/Symptoms: Your throat may feel dry or sore from the anesthesia or the breathing tube placed in your throat during surgery. If this causes discomfort, gargle with warm salt water. The discomfort should disappear within 24 hours.  If you had a scopolamine patch placed behind your ear for the management of post- operative nausea and/or vomiting:  1. The medication in the patch is effective for 72 hours, after which it should be removed.  Wrap patch in a tissue and discard in the trash. Wash hands thoroughly with soap and water. 2. You may remove the patch earlier than 72 hours if you experience unpleasant side effects which may include dry mouth, dizziness or visual disturbances. 3. Avoid touching the patch. Wash your hands with soap and water after contact with the patch.

## 2017-10-17 NOTE — Transfer of Care (Signed)
Immediate Anesthesia Transfer of Care Note  Patient: Bonnie Dorsey  Procedure(s) Performed: RADIOACTIVE SEED X'S 2 GUIDED EXCISIONAL RIGHT BREAST BIOPSY (Right Breast)  Patient Location: PACU  Anesthesia Type:General  Level of Consciousness: sedated  Airway & Oxygen Therapy: Patient Spontanous Breathing and Patient connected to face mask oxygen  Post-op Assessment: Report given to RN and Post -op Vital signs reviewed and stable  Post vital signs: Reviewed and stable  Last Vitals:  Vitals Value Taken Time  BP    Temp    Pulse 82 10/17/2017 12:34 PM  Resp 12 10/17/2017 12:34 PM  SpO2 99 % 10/17/2017 12:34 PM  Vitals shown include unvalidated device data.  Last Pain:  Vitals:   10/17/17 0942  TempSrc: Oral  PainSc: 0-No pain         Complications: No apparent anesthesia complications

## 2017-10-17 NOTE — Interval H&P Note (Signed)
History and Physical Interval Note:  10/17/2017 11:22 AM  Bonnie Dorsey  has presented today for surgery, with the diagnosis of RIGHT BREAST LOBULAR CARCINOMA IN SITU AND RIGHT BREAST ATYPICAL DUCTAL HYPERPLASIA  The various methods of treatment have been discussed with the patient and family. After consideration of risks, benefits and other options for treatment, the patient has consented to  Procedure(s): RADIOACTIVE SEED X'S 2 GUIDED EXCISIONAL RIGHT BREAST BIOPSY X'S 2 (Right) as a surgical intervention .  The patient's history has been reviewed, patient examined, no change in status, stable for surgery.  I have reviewed the patient's chart and labs.  Questions were answered to the patient's satisfaction.     Rolm Bookbinder

## 2017-10-17 NOTE — Op Note (Signed)
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 Serita Grammes Anesthesia: General Specimens: Right breast tissue marked with paint, 1 seed separate Complications: None Drains: None Sponge needle count was correct at completion Disposition to recovery stable  Indications: This is a 52 year old female who underwent core biopsy of 2 right breast lesions.  These are both consistent with atypia.  We discussed options and elected to proceed with a seed guided excisional biopsy x2.  She had 2 seeds placed prior to beginning.  1 of her prior clips had migrated away from the biopsy site.  Procedure: After informed consent was obtained the patient was taken to the operating room.  She was given antibiotics.  SCDs were in place.  She was placed under general anesthesia without complication.  She was prepped and draped in the standard sterile surgical fashion.  A surgical timeout was then performed.  Both seeds were located and what was a very dense lateral breast.  I elected to make a curvilinear incision in the upper outer quadrant overlying both of the seeds.  I infiltrated this with Marcaine and made an incision.  I then used the neoprobe to guide excision of this area.  There was a hematoma and some very dense tissue.  The seed with was in a small hematoma cavity and I remove this and placed it in a container separately to maintain control.  I continued to remove the entire area which also included the other seed and the clip that was next to it.  The clip that had migrated was not removed.  I then marked this and passed this off the table as a specimen.  This was all confirmed by mammography.  Hemostasis was observed.  I then closed the tissue with 2-0 Vicryl.  The skin was closed with 3-0 Vicryl for  Monocryl.  Glue and Steri-Strips were applied.  She had a breast binder placed.  She tolerated this well was extubated transferred to recovery stable.

## 2017-10-18 ENCOUNTER — Encounter (HOSPITAL_BASED_OUTPATIENT_CLINIC_OR_DEPARTMENT_OTHER): Payer: Self-pay | Admitting: General Surgery

## 2017-10-18 NOTE — Anesthesia Postprocedure Evaluation (Signed)
Anesthesia Post Note  Patient: Bonnie Dorsey  Procedure(s) Performed: RADIOACTIVE SEED X'S 2 GUIDED EXCISIONAL RIGHT BREAST BIOPSY (Right Breast)     Patient location during evaluation: PACU Anesthesia Type: General Level of consciousness: awake and alert Pain management: pain level controlled Vital Signs Assessment: post-procedure vital signs reviewed and stable Respiratory status: spontaneous breathing, nonlabored ventilation, respiratory function stable and patient connected to nasal cannula oxygen Cardiovascular status: blood pressure returned to baseline and stable Postop Assessment: no apparent nausea or vomiting Anesthetic complications: no    Last Vitals:  Vitals:   10/17/17 1330 10/17/17 1420  BP: 130/76 137/84  Pulse: 83 84  Resp: 16 15  Temp:  36.6 C  SpO2: 97% 97%    Last Pain:  Vitals:   10/17/17 1420  TempSrc:   PainSc: 3    Pain Goal:                 Elaysha Bevard S

## 2019-10-10 ENCOUNTER — Ambulatory Visit (INDEPENDENT_AMBULATORY_CARE_PROVIDER_SITE_OTHER): Payer: Self-pay | Admitting: Obstetrics and Gynecology

## 2019-10-10 ENCOUNTER — Encounter: Payer: Self-pay | Admitting: Obstetrics and Gynecology

## 2019-10-10 ENCOUNTER — Other Ambulatory Visit: Payer: Self-pay

## 2019-10-10 VITALS — BP 124/82 | Ht 62.0 in | Wt 172.0 lb

## 2019-10-10 DIAGNOSIS — N951 Menopausal and female climacteric states: Secondary | ICD-10-CM

## 2019-10-10 DIAGNOSIS — D219 Benign neoplasm of connective and other soft tissue, unspecified: Secondary | ICD-10-CM

## 2019-10-10 DIAGNOSIS — R102 Pelvic and perineal pain unspecified side: Secondary | ICD-10-CM

## 2019-10-10 DIAGNOSIS — N9489 Other specified conditions associated with female genital organs and menstrual cycle: Secondary | ICD-10-CM

## 2019-10-10 DIAGNOSIS — R109 Unspecified abdominal pain: Secondary | ICD-10-CM

## 2019-10-10 NOTE — Progress Notes (Signed)
Akeyla Molden 11/09/1965 [id number]  SUBJECTIVE:  54 y.o. G2P2002 female new patient presents for evaluation of a fibroid uterus.  She was seen by primary provider for evaluation of abdominal pressure and discomfort.  She had a pelvic ultrasound 09/12/2019 through the [facility] system indicating an enlarged uterus 17.4 x 9.4 x 15.8 cm with multiple myomatous masses, largest measuring 12.1 x 8.1 x 8.5 cm.  Her endometrium measures 2 mm thickness.  Trace fluid in the endometrium with a 1.4 cm nodule present within the endometrium possibly consistent with a polyp.  Bilateral ovaries are normal.  She describes having intermittent mild abdominal pressure and discomfort in the pelvic area but not overly bothersome to her.  She says her last regular menstrual period was 02/2019, she did not have any bleeding for about 3 months until a little bit of bleeding 05/2019 and then no bleeding since then.  She typically had heavy periods when she was having regular periods.  Also had dysmenorrhea.  Professional Spanish interpreter is present at the encounter today.  She did indicate she wanted a female doctor but when given the choice of proceeding today versus rescheduling, she did decided to continue with the visit.  Current Outpatient Medications  Medication Sig Dispense Refill  . simvastatin (ZOCOR) 20 MG tablet Take 20 mg by mouth daily. (Patient not taking: Reported on 10/10/2019)     No current facility-administered medications for this visit.   Allergies: Patient has no known allergies.  Patient's last menstrual period was 03/12/2019.  Past medical history,surgical history, problem list, medications, allergies, family history and social history were all reviewed and documented as reviewed in the EPIC chart.  ROS:  Feeling well. No dyspnea or chest pain on exertion.  + mild abdominal pain, no change in bowel habits, no black or bloody stools.  No urinary tract symptoms. GYN ROS: infrequent menses, no  abnormal bleeding, +mild pelvic pain no discharge, no breast pain or new or enlarging lumps on self exam. No neurological complaints.  OBJECTIVE:  BP 124/82   Ht 5\' 2"  (1.575 m)   Wt 172 lb (78 kg)   LMP 03/12/2019   BMI 31.46 kg/m  The patient appears well, alert, oriented x 3, in no distress. PELVIC EXAM: Deferred to future visit    ASSESSMENT:  54 y.o. [id number] here for management recommendations regarding an enlarged fibroid uterus with mild symptoms, also likely an endometrial polyp, perimenopausal  PLAN:  We reviewed the pelvic ultrasound imaging report (images not available to review). She has large fibroids and enlarged uterus as a result.  We spent time today discussing fibroids and how they are a vast majority of the time benign findings.  I did discuss some of the symptoms that would potentially indicate rapid growth of the fibroid such as increased pelvic pressure, discomfort, changes in bowel habits, or increased vaginal bleeding and/or heavier periods.  Even with fibroids that do grow, malignancy (leiomyosarcoma) is quite uncommon.  I discussed that as patients enter menopause fibroids are expected to stabilize in size and usually shrink.  The only way to confirm that the uterine masses are indeed benign fibroids would be to excise via myomectomy or hysterectomy and have pathology testing.  Used diagrams to help illustrate our discussion today.  Reasonable initial management for minimally symptomatic fibroids and perimenopause would be scheduled NSAIDs (ibuprofen or naproxen) for a few days each month.  Other management will be necessary if any abnormal bleeding were to develop.  I discussed that  if her abdominal pressure and pelvic discomfort symptoms are manageable, that going through a major surgery like hysterectomy is not entirely necessary.  If she chooses not to have surgery, I would recommend annual pelvic exam follow-up to see if the fibroids are growing (since they  should not be as she is going into menopause).  If she does want to undergo surgery to address the fibroids, we could consider hysterectomy versus myomectomy.  At the very least, if she chooses not to do hysterectomy or myomectomy, I would recommend a hysteroscopy D&C to evaluate the endometrial mass finding as this could represent a polyp, or possibly just a submucosal fibroid.  She is not having any abnormal uterine bleeding outside of the expected range for perimenopausal menstrual changes.  Another procedural management option would be referral to interventional radiology for uterine artery embolization. With the large size of her fibroids she would not be a candidate for radiofrequency ablative treatment of fibroids.  I provided her with printouts in Spanish from [employer] on uterine fibroids, hysterectomy, myomectomy, hysteroscopy D&C at checkout.  She will review the information and let me know how she would like to proceed.  At her next visit I would recommend performing a pelvic examination and if she is considering hysterectomy this would help [place] determine if we could perform this by minimally invasive/robotic route versus open abdominal hysterectomy.  We did briefly review hysterectomy and the postoperative recovery requirements, MIS versus open abdominal surgery recovery considerations, general overview of the risks of surgery to include infection, bleeding, needing blood transfusion, internal organ injury, which would all be discussed in more detail later if she decides that she wants a hysterectomy.  She will plan to make a follow-up appointment within the next few weeks.   Joseph Pierini MD 10/10/19

## 2019-10-10 NOTE — Patient Instructions (Addendum)
Educacin para el paciente: Fibromas uterinos (Conceptos Bsicos) View in [place] Redactado por los mdicos y editores de UpToDate There is a newer version of this topic available in [place]. Hay una versin de este artculo ms actualizada disponible en [place]. Qu son los fibromas?--Los fibromas son bolas duras de msculo que se forman en el tero (figura 1). El tero, tambin llamado matriz, es la parte del cuerpo donde se aloja un beb en caso de [place]. A veces, se [place] la palabra "tumores" para referirse a los fibromas. Sin embargo, los fibromas no son un tipo de Hotel manager. Tan solo son crecimientos anormales en el msculo del tero. Cules son los sntomas de los fibromas?--En general, los fibromas no provocan ningn sntoma, pero cuando s los provocan, estos pueden ser algunos: ?Periodos intensos ?Dolor, presin o sensacin de "llenura" en el rea del estmago ?Necesidad de Garment/textile technologist con frecuencia ?Evacuacin con poca frecuencia (estreimiento) ?Dificultad para quedar embarazada Cmo se tratan los fibromas?--Existen varias opciones de [place], cada una con ventajas y desventajas. El tratamiento adecuado para usted depender de: ?Los sntomas que tiene ?Su edad (ya que la mayora de los fibromas se encogen y dejan de causar sntomas despus de la menopausia) ?Si ya no quiere tener hijos ?Si los fibromas la Cabin crew tanto que padece de anemia ?El [place], la cantidad y la ubicacin de los fibromas ?Cmo se siente con respecto a los riesgos y beneficios de cada opcin Si est considerando el tratamiento, pregunte a su mdico o enfermero qu tratamiento podra ayudarla. Luego, infrmese acerca de los riesgos y beneficios de cada opcin. Tambin pregunte qu sucede si no se somete a ningn tratamiento, y asegrese de Engineer, manufacturing si quiere o no tener hijos ms adelante. Estas son las opciones: ?Dewey pldoras, los parches, los anillos vaginales, las inyecciones y los  implantes utilizados como mtodo anticonceptivo pueden disminuir el sangrado durante el periodo. Algunos tipos de dispositivos intrauterinos, o DIU, tambin pueden disminuir la intensidad del periodo. Adems de los mtodos de planificacin familiar, hay medicinas que pueden hacer que los sangrados sean menos intensos. Si el sangrado es su sntoma principal, el mdico podra recetarle alguna de esas medicinas. ?Ciruga para extirpar los fibromas - Se llama "miomectoma". Durante esta operacin, el mdico extirpa los fibromas pero deja el tero en su lugar. Es Armed forces logistics/support/administrative officer, pero no siempre es una solucin permanente, ya que los fibromas pueden volver a producirse. En general, la miomectoma es una buena opcin para quienes podran querer tener un embarazo ms adelante. ?Tratamiento para destruir la capa interior del tero - Se llama "ablacin endometrial". Durante este procedimiento, el mdico introduce un tubo delgado en la vagina y el cuello uterino hasta llegar al tero. Luego, utiliza instrumentos que coloca a travs de ese tubo para destruir la capa interior del tero. Este procedimiento [employer] sangrado de los periodos intensos, pero no es una opcin para todas las Engineer, manufacturing. Adems, tampoco es adecuado para quienes podran querer Nutritional therapist. ?Tratamiento para interrumpir el suministro de sangre a los fibromas - Se llama "embolizacin de arterias uterinas" o "embolizacin de fibroma uterino". En este procedimiento, el mdico inserta un tubo delgado en una arteria de una pierna y lo desplaza hasta el tero. Luego, utiliza pequeas cuentas de plstico para bloquear la arteria que lleva sangre al fibroma. Despus del procedimiento, el fibroma deja de recibir [place] y se encoge. Este procedimiento no es adecuado para las personas que tal vez quieran quedar embarazadas. ?Ciruga para sacar el tero - Se llama "  histerectoma". Por medio de [place] se eliminan para siempre los fibromas y los problemas que  estos causan. Si se somete a una histerectoma, los fibromas no volvern a Arts administrator, pero usted no podr Chiropodist futuro. Cmo escojo la mejor opcin para m?--Su mdico colaborar con usted para ayudarle a comprender las diferentes opciones de tratamiento y cul sera el efecto de [place]. Juntos decidirn cul es la opcin adecuada para usted. Deber tener en cuenta cun invasiva es cada ciruga y si prefiere una ciruga en lugar de usar medicinas. Tambin es conveniente que piense sobre lo siguiente: ?Si desea tener un embarazo ms adelante - Si es posible que quiera tener hijos, las medicinas o la miomectoma suelen ser las mejores opciones. Si no desea tener hijos, o si ya tiene hijos y no desea tener ms, en general puede elegir cualquiera de las opciones. ?En cunto tiempo es probable que [employer] menopausia - Los sntomas relacionados con los fibromas suelen desaparecer con la menopausia, por lo que su edad podra influir en el tratamiento que elija.   Educacin para el paciente: Histerectoma (Conceptos Bsicos) View in [place] por los mdicos y editores de UpToDate There is a newer version of this topic available in [place]. Hay una versin de este artculo ms actualizada disponible en [place]gl[place]. Qu es la histerectoma?--La histerectoma es una ciruga para sacar el tero (figura 1). El tero es la parte del cuerpo donde se encuentra el beb en caso de [place]. Si se hace una histerectoma, ya no podr [employer]. Existen diferentes tipos de [place] para la histerectoma?--S. Existen cuatro tipos principales de ciruga: ?Histerectoma vaginal - Para hacer una histerectoma vaginal, el mdico hace cortes en el interior de la vagina y retira el tero a travs de la vagina (figura 2). ?Histerectoma laparoscpica - Para hacer una histerectoma laparoscpica, el mdico introduce una pequea cmara e instrumentos a travs de pequeos cortes en el rea  del estmago. Luego, extrae el tero dentro de una bolsa a travs de uno de los cortes en el rea del Paramedic. A veces, los mdicos usan instrumentos que se introducen en el rea del estmago, pero extraen el tero a travs de la vagina.  ?Histerectoma laparoscpica asistida por robot - Es otro tipo de Tour manager. Las herramientas utilizadas para la ciruga se Teaching laboratory technician en un robot controlado por el mdico. ?Histerectoma abdominal - Para hacer una histerectoma abdominal, el mdico hace un corte en el rea del estmago y retira el tero a travs de esa abertura (figura 3). Esto se realiza nicamente cuando no es posible llevar a cabo a los otros tipos de histerectoma, ya que la recuperacin de la histerectoma abdominal es ms larga. Por qu podra necesitar una histerectoma?--La histerectoma podra realizarse para tratar lo siguiente: ?Sangrado anormal - Algunas personas sangran mucho durante su periodo o en momentos en los que no Counselling psychologist. Esto puede llevar a un padecimiento llamado anemia, que puede producir mucho cansancio. ?Fibromas - Los fibromas son bolas duras de msculo que se forman en el tero. Se pueden volver muy grandes y Oceanographer presin en los rganos del rea del [place]. Tambin pueden causar un sangrado anormal. ?Prolapso de rgano plvico - El prolapso de rgano plvico se produce cuando el tero cae hacia la vagina (figura 4). ?Cncer o padecimientos que Risk manager - El cncer puede atacar al tero o al cuello uterino, que es el rgano que separa el tero de la vagina. A veces los mdicos  sugieren extraer estos rganos si las pruebas indican "precncer", es decir, clulas que podran convertirse en cncer. ?Dolor plvico constante - Algunas personas tienen "dolor plvico crnico", un dolor que no desaparece en la zona ubicada justo debajo del ombligo. Puede deberse a un padecimiento llamado endometriosis. A veces, la histerectoma puede  ayudar a tratar [employer]. Qu sucede si no quiero hacerme una histerectoma?--Muchos de los padecimientos que se tratan con una histerectoma tambin se pueden tratar de [employer]. Si no quiere [employer], pregntele a su mdico o enfermero si tiene otras opciones de [place]. Tambin es importante que pregunte qu ocurrir si no se hace la histerectoma. Qu sucede si quiero quedar embarazada?--Si se hace una histerectoma, ya no podr [employer]. Desafortunadamente, si tiene cncer u otro problema grave, la histerectoma podra ser la mejor manera de tratarlo. Si quiere tener hijos, su mdico o enfermero puede conversar con usted acerca de sus opciones. El tero es el nico rgano que se retira en una histerectoma?--Eso depende de lo que usted [place] y [place]. Durante una histerectoma, los mdicos a veces tambin retiran: ?Cuello uterino - El cuello uterino es la parte ms baja del tero y se conecta con la vagina. En la histerectoma vaginal, se debe sacar el cuello uterino. En una histerectoma laparoscpica o abdominal, se puede quitar la parte superior del tero, y el cuello uterino se puede retirar o Electronics engineer. Si se retira el cuello uterino, se llama histerectoma "total". Si el cuello uterino se deja en su sitio, se llama histerectoma "supracervical". Las personas que conservan el cuello uterino tienen que seguir hacindose pruebas de [place]apanicol[place] peridicas para la deteccin del cncer crvicouterino. ?Ovarios - Los ovarios son los rganos que producen vulos y hormonas femeninas, como el estrgeno y Field seismologist. Las hormonas producidas por los ovarios ayudan a que el corazn y los huesos se mantengan saludables y son importantes para otros aspectos de Technical sales engineer. Las personas a quienes se les [employer] Corporation ovarios a Quarry manager un tratamiento hormonal. ?Trompas de Falopio - Las trompas de Falopio transportan los vulos  desde los ovarios hasta el tero. Pueden extraerse solas o junto con los ovarios. A veces, las trompas de [employer] se extraen durante la histerectoma a fin de reducir el riesgo de Pharmacologist futuro. Si va a [employer], pregntele a su mdico si planea retirarle el cuello uterino, los ovarios o las trompas de [place]. Esto es importante ya que la atencin mdica que necesite podra variar segn lo que se retire. Si me hago una histerectoma, debo sacarme los ovarios y las trompas?--Es importante que hable con el mdico antes de la ciruga acerca de la posibilidad de [employer] ovarios. Debe considerar su edad, su estado general de salud y cmo podra afectarla no tener ovarios. ?En las personas que no han atravesado la menopausia, no tener ovarios puede producir bochornos, prdida de masa sea, menos inters en el sexo y otros problemas.  ?En las personas que s han [employer] menopausia, sacarse los ovarios tambin podra aumentar el riesgo de problemas de [place], como las enfermedades cardacas. Sin embargo, los [place] de las investigaciones sobre este tema an no son claros.  Por otro lado, las personas que tienen problemas de salud que empeoran en determinados momentos de su ciclo menstrual a veces se sienten mejor sin los ovarios. Adems, en casos poco frecuentes, los ovarios pueden desarrollar cncer, por lo [employer] a  veces deciden sacrselos. Antes de la [place], hable con su mdico acerca de los beneficios y los riesgos de [employer] ovarios. En ocasiones, las trompas de [employer] se Airline pilot en el momento de la histerectoma, incluso si se dejan los ovarios. Esto puede disminuir el riesgo de un tipo poco frecuente de cncer que puede comenzar en las trompas de [place]. No necesita trompas de Falopio si no va a Teaching laboratory technician.  Cmo ser mi vida?--Los [employer] personas que se realizan una histerectoma pueden vivir una vida  feliz y [place]. Muchas personas se sienten mejor despus de la [place], ya que dejan de [place] los sntomas molestos.   Educacin para el paciente: Dilatacin y Advertising copywriter (Research scientist (medical)) View in [place] por los mdicos y editores de UpToDate Qu son la dilatacin y el legrado?--La dilatacin y el legrado son procedimientos que se utilizan para sacar tejido de la parte interior del tero (figura 1). Tambin se llama raspado o legrado uterino. Durante la dilatacin y el legrado el mdico primero abre (dilata) el cuello del tero. (El cuello uterino es la parte inferior, o el "cuello", del tero). Luego, pasa a travs de la vagina y del cuello uterino un instrumento quirrgico llamado "Water quality scientist", lo desplaza hasta el tero y lo [place] para raspar y Community education officer tejido de ese rgano. La dilatacin y Publishing copy se hacen en un quirfano de un hospital o una clnica. Por qu motivo mi mdico hara una dilatacin y un legrado?--Es posible que su mdico haga una dilatacin y un legrado para descubrir la causa de un sntoma o problema. Durante una dilatacin y un legrado, el mdico extrae Truddie Coco de tejido del tero, la cual posteriormente se puede Physiological scientist en busca de clulas anormales, cncer u otros problemas. Las razones por las cuales podra realizarse una dilatacin y un legrado son, Louie [place]:  ?Detener un sangrado vaginal intenso - Por ejemplo, podran realizarse una dilatacin y un legrado si su periodo es demasiado abundante. ?Determinar la causa de un sangrado anormal - Por ejemplo, si tiene periodos muy abundantes o si tiene sangrado vaginal despus de la menopausia, una dilatacin y un legrado podran ayudar al mdico a descubrir la causa del problema. ?Obtener ms informacin despus del resultado anormal de otra prueba - Por ejemplo, si se realiz una prueba de deteccin de cncer uterino, su mdico podra realizarle una dilatacin y un legrado para obtener ms informacin. Los mdicos  tambin pueden hacer una dilatacin y un legrado por otros motivos. En personas embarazadas o que han estado embarazadas recientemente, el mdico puede hacer una dilatacin y un legrado para: ?Press photographer todo tejido proveniente de un embarazo que haya quedado en el tero despus de un aborto espontneo - Un aborto espontneo, o "prdida del Media planner", se produce cuando el embarazo se interrumpe por s solo antes de que el beb pueda vivir fuera del tero. ?Recruitment consultant tejido del embarazo que haya quedado en el tero despus de un parto. ?Sacar un crecimiento anormal llamado "embarazo molar" que se ha formado en el tero. ?Hacer un aborto (interrumpir un Media planner) Therapist, art.  Qu debo hacer antes de una dilatacin y un legrado?--Su mdico le dir qu hacer antes de una dilatacin y un legrado. Probablemente le dir que no puede comer ni beber nada desde la noche anterior al procedimiento. Es posible que Games developer anterior a su dilatacin y Doctor, general practice mdico le d una medicina que debe introducirse por la vagina Librarian, academic  cerca del cuello del tero. La medicina puede ablandar el cuello del tero o empezar a dilatarlo. Informe a su mdico o enfermero si tiene algn problema para prepararse para la dilatacin y Publishing copy, o si tiene preguntas sobre qu podra suceder. Sander Nephew ocurre durante una dilatacin y un legrado?--Le pondrn un tubo delgado que se coloca en una vena, llamado "va intravenosa", en el brazo o la [place]. Su mdico o enfermero le dar lquidos y medicinas a travs de la va intravenosa. Algunas de estas medicinas producen sensacin de relajamiento, somnolencia o adormecimiento durante el procedimiento. Cuando el mdico hace una dilatacin y un legrado para descubrir la causa de ciertos sntomas, es posible que al mismo tiempo haga otra prueba llamada "histeroscopa". Durante la histeroscopa, el mdico coloca una pequea cmara dentro del tero para ver si hay problemas que  puedan estar causando los sntomas. Si encuentra un crecimiento en el tero, es posible que realice un procedimiento para sacarlo durante la dilatacin y Publishing copy. Cuando un mdico hace una dilatacin y un legrado para tratar un problema o padecimiento, saca cualquier cosa preocupante que haya dentro del tero; incluso podra raspar parte de la capa que recubre el tero. Qu ocurre despus de la dilatacin y el legrado?--Despus de la dilatacin y Publishing copy, su mdico o enfermero la tendr bajo observacin durante un rato para asegurarse de que no tenga ningn problema. Esto podra durar hasta unas cuantas horas. Cuando pueda [employer] [place] [employer], otra persona deber llevarla en auto hasta su casa. Su mdico o enfermero le dir cundo puede comenzar nuevamente sus actividades habituales y [facility] dir cundo es seguro que vuelva a tener relaciones sexuales y Glass blower/designer cosas, por ejemplo tampones, en la vagina. Normalmente, volver a [employer] 4 y 6 semanas despus de la dilatacin y Publishing copy. Cules son los efectos secundarios de la dilatacin y el legrado?--Los efectos secundarios ms frecuentes son clicos menstruales leves y sangrado vaginal leve (prdidas), los cuales pueden [place]. Si siente dolor, puede tomar [employer] para Museum/gallery curator. Despus de la dilatacin y Publishing copy puede haber otros efectos secundarios poco frecuentes, que incluyen: ?Desgarro en el tero ?Lesin en el cuello del tero ?Infeccin ?reas de tejido cicatricial que se forman en el tero Debo llamar a mi mdico o enfermero?--[place] con su mdico o enfermero si tiene alguno de los siguientes problemas despus de la dilatacin y el legrado: ?Cristy Hilts superior a 100.4 F (38 C) ?Clicos menstruales que duran ms [place] ?Dolor en aumento ?Sangrado vaginal abundante o que dura ms de 2 semanas ?Flujo vaginal de color verde o maloliente

## 2019-11-05 ENCOUNTER — Ambulatory Visit (INDEPENDENT_AMBULATORY_CARE_PROVIDER_SITE_OTHER): Payer: Self-pay | Admitting: Obstetrics and Gynecology

## 2019-11-05 ENCOUNTER — Encounter: Payer: Self-pay | Admitting: Obstetrics and Gynecology

## 2019-11-05 ENCOUNTER — Other Ambulatory Visit: Payer: Self-pay

## 2019-11-05 VITALS — BP 122/84

## 2019-11-05 DIAGNOSIS — D219 Benign neoplasm of connective and other soft tissue, unspecified: Secondary | ICD-10-CM

## 2019-11-05 DIAGNOSIS — N9489 Other specified conditions associated with female genital organs and menstrual cycle: Secondary | ICD-10-CM

## 2019-11-05 DIAGNOSIS — N951 Menopausal and female climacteric states: Secondary | ICD-10-CM

## 2019-11-05 NOTE — Progress Notes (Signed)
   Bonnie Dorsey [birth year] [id number]  SUBJECTIVE:  54 y.o. [id number] female with a fibroid uterus returns today with her daughter present for recommendations following her initial encounter with 10/10/2019.  She has an enlarged uterus that measured on ultrasound 17.4 x 9.4 x 15.8 cm with a large fibroid measuring over 12 cm, which has been relatively asymptomatic.  LMP 02/2019 and then had slight bleeding 05/2019 and continues to be amenorrheic since then.  Ultrasound also indicated a 1.4 cm nodule present in the endometrium consistent with a polyp.  She has not had any further vaginal bleeding.  Professional Spanish interpreter present for the visit today.  No current outpatient medications on file.   No current facility-administered medications for this visit.   Allergies: Patient has no known allergies.  Patient's last menstrual period was 03/07/2019.  Past medical history,surgical history, problem list, medications, allergies, family history and social history were all reviewed and documented as reviewed in the EPIC chart.  ROS:  Feeling well. No dyspnea or chest pain on exertion.  + mild abdominal pain, no change in bowel habits, no black or bloody stools.  No urinary tract symptoms. GYN ROS: infrequent menses, no abnormal bleeding, +mild pelvic pain no discharge   OBJECTIVE:  BP 122/84   LMP 03/07/2019  The patient appears well, alert, oriented x 3, in no distress. PELVIC EXAM: Deferred to next visit   ASSESSMENT:  54 y.o. [id number] here for discussion of next steps in evaluation for enlarged fibroid uterus and possible endometrial polyp perimenopausal with abnormal uterine bleeding  PLAN:  We discussed options of proceeding with sonohysterogram versus hysteroscopy D&C.  Need to better characterize this 1.4 cm lesion in the endometrium which could be a polyp, but also could be a submucosal fibroid.  If it appears to be a polyp we want to proceed to [facility].  I used diagrams to help  illustrate our discussion today.  She will plan to make an appointment for [facility].  I reviewed that her fibroids actually seem to be causing minimal symptoms at this time so I would not recommend proceeding with hysterectomy unless that changes.   Joseph Pierini MD 11/05/19

## 2019-11-28 ENCOUNTER — Encounter: Payer: Self-pay | Admitting: Obstetrics and Gynecology

## 2019-11-28 ENCOUNTER — Ambulatory Visit (INDEPENDENT_AMBULATORY_CARE_PROVIDER_SITE_OTHER): Payer: Self-pay | Admitting: Obstetrics and Gynecology

## 2019-11-28 ENCOUNTER — Ambulatory Visit (INDEPENDENT_AMBULATORY_CARE_PROVIDER_SITE_OTHER): Payer: Self-pay

## 2019-11-28 ENCOUNTER — Other Ambulatory Visit: Payer: Self-pay | Admitting: Obstetrics and Gynecology

## 2019-11-28 ENCOUNTER — Other Ambulatory Visit: Payer: Self-pay

## 2019-11-28 VITALS — BP 120/74

## 2019-11-28 DIAGNOSIS — N951 Menopausal and female climacteric states: Secondary | ICD-10-CM

## 2019-11-28 DIAGNOSIS — N9489 Other specified conditions associated with female genital organs and menstrual cycle: Secondary | ICD-10-CM

## 2019-11-28 DIAGNOSIS — N854 Malposition of uterus: Secondary | ICD-10-CM

## 2019-11-28 DIAGNOSIS — R9389 Abnormal findings on diagnostic imaging of other specified body structures: Secondary | ICD-10-CM

## 2019-11-28 DIAGNOSIS — D219 Benign neoplasm of connective and other soft tissue, unspecified: Secondary | ICD-10-CM

## 2019-11-28 DIAGNOSIS — N840 Polyp of corpus uteri: Secondary | ICD-10-CM

## 2019-11-28 DIAGNOSIS — R102 Pelvic and perineal pain: Secondary | ICD-10-CM

## 2019-11-28 NOTE — Progress Notes (Signed)
   Bonnie Dorsey [birth year] [id number]  SUBJECTIVE:  54 y.o. [phone] female presents for a pelvic ultrasound and will schedule for a sonohysterogram to work-up a recent perimenopausal abnormal uterine bleeding episode.  See the previous note for details.  She has a known fibroid uterus with a large fibroid measuring around 12 cm.  Overall asymptomatic.  Her last regular LMP was 02/2019, amenorrheic for 3 months and then she had slight menstrual bleeding 05/2019, continues to have almost no bleeding since then, she did have a little light bleeding a few days ago which has spontaneously stopped.  Professional Spanish interpreter is present at the encounter today.  No current outpatient medications on file.   No current facility-administered medications for this visit.   Allergies: Patient has no known allergies.  No LMP recorded. (Menstrual status: Perimenopausal).  Past medical history,surgical history, problem list, medications, allergies, family history and social history were all reviewed and documented as reviewed in the EPIC chart.  ROS: Pertinent negatives and positives as reviewed in HPI.  OBJECTIVE:  BP 120/74  The patient appears well, alert, oriented x 3, in no distress. PELVIC EXAM: Deferred  Pelvic ultrasound Anteverted uterus 10.6 x 12.5 x 9.5 cm Several fibroids, largest is a subserosal fibroid 11.4 x 9.8 cm in the lower uterine segment, displaces uterus anteriorly and to the left in the pelvis. Best images are obtained transabdominally due to the low-lying fibroid. Endometrium with presence of fluid in the cavity outlining a polyp 0.8 x 0.6 cm without Doppler blood flow to it. Bilateral ovaries normal with normal follicle pattern less than 1.5 cm in size. No adnexal masses.  No free fluid.  ASSESSMENT:  54 y.o. [id number] with endometrial polyp and perimenopausal abnormal uterine bleeding episodes  PLAN:  We discussed that since she seems to be fairly asymptomatic in  regards to the large fibroid that I would continue to recommend against hysterectomy at this time just for the presence of a symptomatic fibroids.  The abnormal bleeding episodes that she has been having are very infrequent and menstrual flow is light.  She is not having any heavy/prolonged bleeding episodes.  It is possible that this is a characteristic of her perimenopausal hormonal status, but it is also possible that the polyp is contributing to the AUB.   I do continue to recommend hysteroscopy D&C with polypectomy so we can remove the polyp and have it evaluated.  I reiterated that D&C would not do anything to address the fibroid uterus.  We briefly discussed that hysteroscopy D&C is a minor outpatient procedure and is performed under anesthesia at the [facility].  She would like to consider the option and will let us know when she is ready to proceed.   Joseph Pierini MD 11/28/19

## 2019-12-18 ENCOUNTER — Telehealth: Payer: Self-pay

## 2019-12-18 NOTE — Telephone Encounter (Signed)
Patient called Bonnie Dorsey asking about cost of procedure.   I wrote CLaudia: "The estimated charge for her surgery is $940.00 and she receives a 56% private pay discount. That amount is 526.00. That means her estimated surgery prepayment amount due by one week before surgery is $414.00.   The phone number for the [place] to assist her with estimating her costs is [phone]. The CPT code is [id number].   I could also offer her Friday October 15 at this time. She will need a Covid test on the Tuesday before surgery and will have to quarantine for the time after the test until surgery. "  Bonnie Dorsey spoke with her and relayed this information. She said she will consider and call back if she would like to schedule surgery.

## 2020-01-13 ENCOUNTER — Telehealth: Payer: Self-pay

## 2020-01-13 NOTE — Telephone Encounter (Signed)
Patient called and spoke with Rosemarie Ax and is ready to proceed with D&C, Hysteroscopy.

## 2020-01-13 NOTE — Telephone Encounter (Signed)
Rosemarie Ax called patient on my behalf and relayed this information: "As previously discussed her surgery prepymt amount is $414.00 due by one week prior to surgery.   Would she like to be scheduled Tuesday Nov 8th at 7:30am at [facility]?  She will check in at 5:30am. She will need to have a Covid test on the Friday 11/5 before surgery. She will need to quarantine at home from the time of the test until surgery. Let me know what time she would like to go for that test 9-3 every 10 mins. She will drive thru at [address] I will send a map and a Covid handout.   She will need a pre op visit with Dr. Delilah Shan anytime now to surgery."    Rosemarie Ax spoke with her and replied that patient would like to schedule for this date and Covid test around 1pm.  I will mail a pamphlet to her.

## 2020-01-16 NOTE — Telephone Encounter (Signed)
Surgery order is submitted, thank you

## 2020-01-24 ENCOUNTER — Encounter (HOSPITAL_BASED_OUTPATIENT_CLINIC_OR_DEPARTMENT_OTHER): Payer: Self-pay | Admitting: Obstetrics and Gynecology

## 2020-01-24 NOTE — Progress Notes (Signed)
Spoke w/ via phone for pre-op interview--- Pt thru [employer] interpreter ID# [phone] Lab needs dos---- Urine preg              Lab results------ no COVID test ------ 01-30-2020 @ 1305 Arrive at ------- 0530 NPO after MN  Medications to take morning of surgery ----- NONE Diabetic medication ----- n/a Patient Special Instructions ----- n/a Pre-Op special Istructions -----  Pt request her daugher, Bonnie Dorsey , interpret for her dos. Stated she speaks good english and she 54 yrs old. Patient verbalized understanding of instructions that were given at this phone interview. Patient denies shortness of breath, chest pain, fever, cough at this phone interview.

## 2020-01-28 ENCOUNTER — Encounter: Payer: Self-pay | Admitting: Obstetrics and Gynecology

## 2020-01-28 ENCOUNTER — Ambulatory Visit (INDEPENDENT_AMBULATORY_CARE_PROVIDER_SITE_OTHER): Payer: Self-pay | Admitting: Obstetrics and Gynecology

## 2020-01-28 ENCOUNTER — Other Ambulatory Visit: Payer: Self-pay

## 2020-01-28 VITALS — BP 110/74

## 2020-01-28 DIAGNOSIS — D219 Benign neoplasm of connective and other soft tissue, unspecified: Secondary | ICD-10-CM

## 2020-01-28 DIAGNOSIS — N840 Polyp of corpus uteri: Secondary | ICD-10-CM

## 2020-01-28 DIAGNOSIS — N939 Abnormal uterine and vaginal bleeding, unspecified: Secondary | ICD-10-CM

## 2020-01-28 NOTE — Progress Notes (Signed)
   Wai Minotti 1965-09-28 [id number]  SUBJECTIVE:  54 y.o. [id number] female presents for a preoperative consultation for an upcoming hysteroscopy D&C with polypectomy and possible hysteroscopic morcellation/myomectomy of submucosal fibroids.  She has an enlarged fibroid uterus, of note she feels like it is becoming smaller not as palpable through her abdomen as it used to be.  To review, her last regular LMP was 02/2019, and then she had a little bit of light bleeding 05/2019 and again several months later which led her to come in for evaluation.  She has not had any further vaginal bleeding since her recent work-up.  Her pelvic ultrasound/SHGM here showed the largest fibroid was subserosal measuring 11.4 x 9.8 cm, and in the endometrial cavity she had polyp 8 mm in size.  Professional Spanish interpreter is present for the encounter today.  No current outpatient medications on file.   No current facility-administered medications for this visit.   Allergies: Apple  No LMP recorded. (Menstrual status: Perimenopausal).  Past medical history,surgical history, problem list, medications, allergies, family history and social history were all reviewed and documented as reviewed in the EPIC chart.  ROS: Pertinent positives and negatives as reviewed in HPI   OBJECTIVE:  BP 110/74  The patient appears well, alert, oriented, in no distress. PELVIC EXAM: Deferred as this was previously performed   ASSESSMENT:  54 y.o. [id number] here for preoperative consultation  PLAN:  I recommend performing hysteroscopy, dilation and curettage, possible polypectomy and MyoSure myomectomy to evaluate for any intrauterine pathology.  We are suspecting an endometrial polyp based on her abnormal bleeding pattern and the ultrasound findings, but we discussed that sometimes we also or instead see submucosal fibroids under hysteroscopic view.  We will plan to perform polypectomy and/or myomectomy as needed.  I discussed  the same-day outpatient intent of the ablation procedure, and risks of infection, bleeding, possible need for blood transfusion, perforation of uterus and/or cervix resulting in injury to surrounding organ structures including major pelvic blood vessels, bowel, bladder, and potentially ureter, and DVT.  Anesthesia will either be general or MAC depending on the anesthesiologist's choice, and inherent risks with being placed under anesthesia include myocardial infarction, stroke, rarely death.  Very rarely would laparoscopy and/or laparotomy be indicated to tend to any intra-abdominal hemorrhage and/or injury concern.   Postoperative recovery expectations of needing a day or two of rest in the absence of any complication were also discussed.   She tells me she did receive the information in Spanish regarding the procedure after the last visit and had a chance to review it.  All questions are answered and the patient agrees to proceed.    Joseph Pierini MD 01/28/20

## 2020-01-30 ENCOUNTER — Other Ambulatory Visit (HOSPITAL_COMMUNITY)
Admission: RE | Admit: 2020-01-30 | Discharge: 2020-01-30 | Disposition: A | Discharge status: Home / Self Care | Payer: HRSA Program | Source: Ambulatory Visit | Attending: Obstetrics and Gynecology | Admitting: Obstetrics and Gynecology

## 2020-01-30 DIAGNOSIS — Z20822 Contact with and (suspected) exposure to covid-19: Secondary | ICD-10-CM | POA: Insufficient documentation

## 2020-01-30 DIAGNOSIS — Z01812 Encounter for preprocedural laboratory examination: Secondary | ICD-10-CM | POA: Insufficient documentation

## 2020-01-30 LAB — SARS CORONAVIRUS 2 (TAT 6-24 HRS): SARS Coronavirus 2: NEGATIVE

## 2020-01-31 NOTE — Anesthesia Preprocedure Evaluation (Addendum)
Anesthesia Evaluation  Patient identified by MRN, date of birth, ID band Patient awake    Reviewed: Allergy & Precautions, NPO status , Patient's Chart, lab work & pertinent test results  Airway Mallampati: I  TM Distance: >3 FB Neck ROM: Full    Dental no notable dental hx. (+) Teeth Intact, Dental Advisory Given   Pulmonary neg pulmonary ROS,    Pulmonary exam normal breath sounds clear to auscultation       Cardiovascular negative cardio ROS Normal cardiovascular exam Rhythm:Regular Rate:Normal     Neuro/Psych negative neurological ROS  negative psych ROS   GI/Hepatic negative GI ROS, Neg liver ROS,   Endo/Other  negative endocrine ROS  Renal/GU negative Renal ROS     Musculoskeletal negative musculoskeletal ROS (+)   Abdominal   Peds  Hematology negative hematology ROS (+) anemia ,   Anesthesia Other Findings   Reproductive/Obstetrics negative OB ROS                            Anesthesia Physical Anesthesia Plan  ASA: I  Anesthesia Plan: General   Post-op Pain Management:    Induction: Intravenous  PONV Risk Score and Plan: 4 or greater and Treatment may vary due to age or medical condition, Midazolam, Ondansetron, Dexamethasone and Scopolamine patch - Pre-op  Airway Management Planned: LMA  Additional Equipment: None  Intra-op Plan:   Post-operative Plan:   Informed Consent: I have reviewed the patients History and Physical, chart, labs and discussed the procedure including the risks, benefits and alternatives for the proposed anesthesia with the patient or authorized representative who has indicated his/her understanding and acceptance.     Dental advisory given and Interpreter used for interveiw  Plan Discussed with: CRNA and Anesthesiologist  Anesthesia Plan Comments: ( Spanish interpreter pt Daughter LMA GA)       Anesthesia Quick Evaluation

## 2020-02-03 ENCOUNTER — Ambulatory Visit (HOSPITAL_BASED_OUTPATIENT_CLINIC_OR_DEPARTMENT_OTHER): Payer: Self-pay | Admitting: Anesthesiology

## 2020-02-03 ENCOUNTER — Ambulatory Visit (HOSPITAL_BASED_OUTPATIENT_CLINIC_OR_DEPARTMENT_OTHER)
Admission: RE | Admit: 2020-02-03 | Discharge: 2020-02-03 | Disposition: A | Discharge status: Home / Self Care | Payer: Self-pay | Attending: Obstetrics and Gynecology | Admitting: Obstetrics and Gynecology

## 2020-02-03 ENCOUNTER — Encounter (HOSPITAL_BASED_OUTPATIENT_CLINIC_OR_DEPARTMENT_OTHER): Payer: Self-pay | Admitting: Obstetrics and Gynecology

## 2020-02-03 ENCOUNTER — Encounter (HOSPITAL_BASED_OUTPATIENT_CLINIC_OR_DEPARTMENT_OTHER)
Admission: RE | Disposition: A | Discharge status: Home / Self Care | Payer: Self-pay | Source: Home / Self Care | Attending: Obstetrics and Gynecology

## 2020-02-03 DIAGNOSIS — D250 Submucous leiomyoma of uterus: Secondary | ICD-10-CM | POA: Insufficient documentation

## 2020-02-03 DIAGNOSIS — N939 Abnormal uterine and vaginal bleeding, unspecified: Secondary | ICD-10-CM | POA: Insufficient documentation

## 2020-02-03 DIAGNOSIS — N840 Polyp of corpus uteri: Secondary | ICD-10-CM

## 2020-02-03 HISTORY — PX: DILATATION & CURETTAGE/HYSTEROSCOPY WITH MYOSURE: SHX6511

## 2020-02-03 HISTORY — DX: Polyp of corpus uteri: N84.0

## 2020-02-03 HISTORY — DX: Abnormal uterine and vaginal bleeding, unspecified: N93.9

## 2020-02-03 SURGERY — DILATATION & CURETTAGE/HYSTEROSCOPY WITH MYOSURE
Anesthesia: General | Site: Vagina

## 2020-02-03 MED ORDER — FENTANYL CITRATE (PF) 100 MCG/2ML IJ SOLN
INTRAMUSCULAR | Status: DC | PRN
  Administered 2020-02-03: 25 ug via INTRAVENOUS

## 2020-02-03 MED ORDER — OXYCODONE HCL 5 MG/5ML PO SOLN: 5.0000 mg | Freq: Once | ORAL | Status: DC | PRN

## 2020-02-03 MED ORDER — SODIUM CHLORIDE 0.9% FLUSH: 3.0000 mL | Freq: Two times a day (BID) | INTRAVENOUS | Status: DC

## 2020-02-03 MED ORDER — MIDAZOLAM HCL 2 MG/2ML IJ SOLN
INTRAMUSCULAR | Status: DC | PRN
  Administered 2020-02-03: 2 mg via INTRAVENOUS

## 2020-02-03 MED ORDER — KETOROLAC TROMETHAMINE 30 MG/ML IJ SOLN
INTRAMUSCULAR | Status: AC
  Filled 2020-02-03: qty 1

## 2020-02-03 MED ORDER — ONDANSETRON HCL 4 MG/2ML IJ SOLN
INTRAMUSCULAR | Status: AC
  Filled 2020-02-03: qty 2

## 2020-02-03 MED ORDER — BUPIVACAINE HCL (PF) 0.25 % IJ SOLN
INTRAMUSCULAR | Status: DC | PRN
  Administered 2020-02-03: 10 mL

## 2020-02-03 MED ORDER — KETOROLAC TROMETHAMINE 30 MG/ML IJ SOLN: 30.0000 mg | Freq: Once | INTRAMUSCULAR | Status: DC | PRN

## 2020-02-03 MED ORDER — PROPOFOL 10 MG/ML IV BOLUS
INTRAVENOUS | Status: DC | PRN
  Administered 2020-02-03: 150 mg via INTRAVENOUS

## 2020-02-03 MED ORDER — AMISULPRIDE (ANTIEMETIC) 5 MG/2ML IV SOLN: 10.0000 mg | Freq: Once | INTRAVENOUS | Status: DC | PRN

## 2020-02-03 MED ORDER — LIDOCAINE 2% (20 MG/ML) 5 ML SYRINGE
INTRAMUSCULAR | Status: DC | PRN
  Administered 2020-02-03: 100 mg via INTRAVENOUS

## 2020-02-03 MED ORDER — KETOROLAC TROMETHAMINE 30 MG/ML IJ SOLN
INTRAMUSCULAR | Status: DC | PRN
  Administered 2020-02-03: 30 mg via INTRAVENOUS

## 2020-02-03 MED ORDER — OXYCODONE HCL 5 MG PO TABS: 5.0000 mg | ORAL_TABLET | Freq: Once | ORAL | Status: DC | PRN

## 2020-02-03 MED ORDER — PROPOFOL 10 MG/ML IV BOLUS
INTRAVENOUS | Status: AC
  Filled 2020-02-03: qty 40

## 2020-02-03 MED ORDER — EPHEDRINE 5 MG/ML INJ
INTRAVENOUS | Status: AC
  Filled 2020-02-03: qty 10

## 2020-02-03 MED ORDER — MIDAZOLAM HCL 2 MG/2ML IJ SOLN
INTRAMUSCULAR | Status: AC
  Filled 2020-02-03: qty 2

## 2020-02-03 MED ORDER — ONDANSETRON HCL 4 MG/2ML IJ SOLN: 4.0000 mg | Freq: Once | INTRAMUSCULAR | Status: DC | PRN

## 2020-02-03 MED ORDER — HYDROMORPHONE HCL 1 MG/ML IJ SOLN: 0.2500 mg | INTRAMUSCULAR | Status: DC | PRN

## 2020-02-03 MED ORDER — DEXAMETHASONE SODIUM PHOSPHATE 10 MG/ML IJ SOLN
INTRAMUSCULAR | Status: DC | PRN
  Administered 2020-02-03: 10 mg via INTRAVENOUS

## 2020-02-03 MED ORDER — ONDANSETRON HCL 4 MG/2ML IJ SOLN
INTRAMUSCULAR | Status: DC | PRN
  Administered 2020-02-03: 4 mg via INTRAVENOUS

## 2020-02-03 MED ORDER — POVIDONE-IODINE 10 % EX SWAB: 2.0000 "application " | Freq: Once | CUTANEOUS | Status: DC

## 2020-02-03 MED ORDER — LIDOCAINE 2% (20 MG/ML) 5 ML SYRINGE
INTRAMUSCULAR | Status: AC
  Filled 2020-02-03: qty 5

## 2020-02-03 MED ORDER — EPHEDRINE SULFATE-NACL 50-0.9 MG/10ML-% IV SOSY
PREFILLED_SYRINGE | INTRAVENOUS | Status: DC | PRN
  Administered 2020-02-03 (×2): 10 mg via INTRAVENOUS

## 2020-02-03 MED ORDER — LACTATED RINGERS IV SOLN: INTRAVENOUS | Status: DC

## 2020-02-03 MED ORDER — DEXAMETHASONE SODIUM PHOSPHATE 10 MG/ML IJ SOLN
INTRAMUSCULAR | Status: AC
  Filled 2020-02-03: qty 1

## 2020-02-03 MED ORDER — SCOPOLAMINE 1 MG/3DAYS TD PT72
1.0000 | MEDICATED_PATCH | TRANSDERMAL | Status: DC
  Administered 2020-02-03: 1.5 mg via TRANSDERMAL

## 2020-02-03 MED ORDER — ARTIFICIAL TEARS OPHTHALMIC OINT
TOPICAL_OINTMENT | OPHTHALMIC | Status: AC
  Filled 2020-02-03: qty 3.5

## 2020-02-03 MED ORDER — SCOPOLAMINE 1 MG/3DAYS TD PT72
MEDICATED_PATCH | TRANSDERMAL | Status: AC
  Filled 2020-02-03: qty 1

## 2020-02-03 MED ORDER — SODIUM CHLORIDE 0.9 % IR SOLN
Status: DC | PRN
  Administered 2020-02-03: 1500 mL

## 2020-02-03 MED ORDER — FENTANYL CITRATE (PF) 100 MCG/2ML IJ SOLN
INTRAMUSCULAR | Status: AC
  Filled 2020-02-03: qty 2

## 2020-02-03 SURGICAL SUPPLY — 21 items
CATH ROBINSON RED A/P 16FR (CATHETERS) ×2 IMPLANT
COVER WAND RF STERILE (DRAPES) ×2 IMPLANT
DEVICE MYOSURE LITE (MISCELLANEOUS) IMPLANT
DEVICE MYOSURE REACH (MISCELLANEOUS) ×1 IMPLANT
DILATOR CANAL MILEX (MISCELLANEOUS) IMPLANT
ELECT REM PT RETURN 9FT ADLT (ELECTROSURGICAL)
ELECTRODE REM PT RTRN 9FT ADLT (ELECTROSURGICAL) IMPLANT
GAUZE 4X4 16PLY RFD (DISPOSABLE) ×2 IMPLANT
GLOVE BIO SURGEON STRL SZ8 (GLOVE) ×2 IMPLANT
GLOVE BIOGEL PI IND STRL 8 (GLOVE) ×2 IMPLANT
GLOVE BIOGEL PI INDICATOR 8 (GLOVE) ×1
GOWN STRL REUS W/TWL XL LVL3 (GOWN DISPOSABLE) ×2 IMPLANT
IV NS IRRIG 3000ML ARTHROMATIC (IV SOLUTION) ×3 IMPLANT
KIT PROCEDURE FLUENT (KITS) ×2 IMPLANT
MYOSURE XL FIBROID (MISCELLANEOUS)
PACK VAGINAL MINOR WOMEN LF (CUSTOM PROCEDURE TRAY) ×2 IMPLANT
PAD OB MATERNITY 4.3X12.25 (PERSONAL CARE ITEMS) ×2 IMPLANT
PAD PREP 24X48 CUFFED NSTRL (MISCELLANEOUS) ×2 IMPLANT
SEAL CERVICAL OMNI LOK (ABLATOR) IMPLANT
SEAL ROD LENS SCOPE MYOSURE (ABLATOR) ×2 IMPLANT
SYSTEM TISS REMOVAL MYOSURE XL (MISCELLANEOUS) IMPLANT

## 2020-02-03 NOTE — Progress Notes (Signed)
Discharge instructions reviewed with patient and her interpreter (daughter).

## 2020-02-03 NOTE — Discharge Instructions (Signed)
Alternate tylenol every 4-6 hours and ibuprofen every 6 hours (that way you are taking one of the two medications every 3 hours) for the first few days after surgery If any severe pain call our office for pain medicine prescription Monitor for signs of excessive bleeding if saturating a pad every hour or more frequently for at least 2 hours, or passing blood clots golf ball size or larger. If this is occurring let us know.

## 2020-02-03 NOTE — H&P (Addendum)
   Bonnie Dorsey 1965/06/08 MRN: [id number]  HPI The patient is a 54 y.o. [id number] who presents today for scheduled hysteroscopy D&C and polypectomy for abnormal uterine bleeding and suspected endometrial polyp in imaging.  No changes to her medical history since her pre op exam, denies CP, SOB, fever/chills, dysuria.  Spanish interpreter present (her daughter).  Past Medical History:  Diagnosis Date  . Abnormal uterine bleeding (AUB)   . Anemia   . Endometrial polyp   . Hyperlipidemia    Past Surgical History:  Procedure Laterality Date  . BREAST SURGERY     cyst removal from right breast  . RADIOACTIVE SEED GUIDED EXCISIONAL BREAST BIOPSY Right 10/17/2017   Procedure: RADIOACTIVE SEED X'S 2 GUIDED EXCISIONAL RIGHT BREAST BIOPSY;  Surgeon: Rolm Bookbinder, MD;  Location: [facility];  Service: General;  Laterality: Right;   Allergies  Allergen Reactions  . Apple Itching    Mouth itches      Physical Exam   BP 135/90   Pulse 81   Temp 99.4 F (37.4 C) (Oral)   Resp 16   Ht 5\' 2"  (1.575 m)   Wt 72.7 kg   LMP 03/25/2019   SpO2 98%   BMI 29.30 kg/m    General: Pleasant female, no acute distress, alert and oriented CV: RRR, no murmurs Pulm: good respiratory effort, CTAB     Plan Proceed with hysteroscopy D&C as planned.  All questions answered and the patient agrees to proceed.   Joseph Pierini, MD 02/03/20

## 2020-02-03 NOTE — Op Note (Signed)
Name: Bonnie Dorsey  Age: 54 y.o.  Date of Birth: [birth year] Medical Record #: [id number]   Operative Note   Preoperative Diagnosis: Abnormal uterine bleeding, enlarged fibroid uterus, suspected endometrial polyp Procedure: Hysteroscopy, Dilatation and Curettage, MyoSure polypectomy/morcellation of submucosal fibroid Postoperative Diagnosis: same and confirmed endometrial polyp and submucosal fibroid Surgeon: Joseph Pierini, MD Estimated Blood Loss: 5 mL Anesthesia: General LMA, local with 0.25% bupivacaine (10 mL) Specimens: Endometrial polyp and fibroid, endometrial curettings Findings: Atrophic cervix nearly flush with top of vagina.  Anteverted uterus. Normal atropic endometrial cavity with a few small partially submucosal (Type 3) fibroids with no significant gross distortion of the endometrial cavity, bilateral fallopian tube ostia are visualized.  Pearl sized Type 1 fibroid visualized at the anterior fundal area right next to what appears to be an endometrial polyp.  Endocervical canal normal. Uterus sounds to 11.5 cm.  Hysteroscopic fluid deficit 215 mL.  Complication: none. Date: 02/03/20     DESCRIPTION OF PROCEDURE:      Preoperative review of the procedure was completed with the patient prior to transport to the operating room including risks, benefits, and alternatives.  The patient's questions were answered and she agreed to proceed.    Under anesthesia, Yuleimy Kretz was placed in the dorsolithotomy position with legs in yellofin stirrups with SCDs applied and running.  A surgical team time out was performed to verify and agree on procedure and patient consent. A bimanual exam was performed.  The patient was prepped and draped in the usual sterile fashion.      Cervix was visualized with a weighted speculum placed in the posterior vagina and a Sims retractor anteriorly.  A paracervical block was applied in the standard fashion using 0.25% Marcaine. The anterior cervix  was grasped with a single-toothed tenaculum. The uterine descensus was noted to be very poor.  The uterine cavity was very gently sounded to establish a measurement of uterine cavity depth.  The weighted speculum was removed and the posterior vagina was retracted with a Deaver as the angle with the weighted speculum in place did not allow for a dilator to be placed in an anteverted direction to find the endometrial cavity.  Cervix was gently dilated using a progressive series of Pratt dilators to size 17.  There was no concern for uterine or cervical perforation.    The MyoSure hysteroscope was introduced and the uterine cavity and ostia were visualized.    Findings were noted as above.  The MyoSure Reach was then inserted into the hysteroscope and under direct visualization the identified endometrial polyp and Type 1 fibroid were both resected without difficulty.  Hemostasis was noted and there was no concern for perforation.  The hysteroscope and MyoSure were then removed.  The cervix was gently dilated a second time to size 21 to allow introduction of a small curettage instrument.  A thorough curettage was productive of scant endometrial tissue which was sent to pathology for analysis as a separate specimen from the polyp/fibroid sample obtained through the Myosure.  The curettage was effective at achieving a uterine cry circumferentially.  Hemostasis was confirmed.  The tenaculum was removed from the cervix and the puncture sites were hemostatic.   Instrument and sponge counts were correct at the conclusion of the procedure. The patient tolerated the procedure well and was brought to the recovery room in stable condition.      Joseph Pierini, M.D., Cherlynn June

## 2020-02-03 NOTE — Anesthesia Procedure Notes (Signed)
Procedure Name: LMA Insertion Date/Time: 02/03/2020 7:37 AM Performed by: Mechele Claude, CRNA Pre-anesthesia Checklist: Patient identified, Emergency Drugs available, Suction available and Patient being monitored Patient Re-evaluated:Patient Re-evaluated prior to induction Oxygen Delivery Method: Circle system utilized Preoxygenation: Pre-oxygenation with 100% oxygen Induction Type: IV induction Ventilation: Mask ventilation without difficulty LMA: LMA inserted LMA Size: 4.0 Number of attempts: 1 Airway Equipment and Method: Bite block Placement Confirmation: positive ETCO2 Tube secured with: Tape Dental Injury: Teeth and Oropharynx as per pre-operative assessment

## 2020-02-03 NOTE — Anesthesia Postprocedure Evaluation (Signed)
Anesthesia Post Note  Patient: Joli Koob  Procedure(s) Performed: DILATATION & CURETTAGE/HYSTEROSCOPY WITH  MYOSURE POLYPECTOMY AND MYOMECTOMY (N/A Vagina )     Patient location during evaluation: PACU Anesthesia Type: General Level of consciousness: awake and alert Pain management: pain level controlled Vital Signs Assessment: post-procedure vital signs reviewed and stable Respiratory status: spontaneous breathing, nonlabored ventilation, respiratory function stable and patient connected to nasal cannula oxygen Cardiovascular status: blood pressure returned to baseline and stable Postop Assessment: no apparent nausea or vomiting Anesthetic complications: no   No complications documented.  Last Vitals:  Vitals:   02/03/20 0921 02/03/20 0937  BP:  117/72  Pulse: 92 90  Resp: 19 15  Temp:  36.5 C  SpO2: 96% 95%    Last Pain:  Vitals:   02/03/20 0937  TempSrc: Axillary  PainSc: 0-No pain                 Barnet Glasgow

## 2020-02-03 NOTE — Transfer of Care (Signed)
  Last Vitals:  Vitals Value Taken Time  BP 127/79 02/03/20 0825  Temp    Pulse 91 02/03/20 0828  Resp 16 02/03/20 0828  SpO2 96 % 02/03/20 0828  Vitals shown include unvalidated device data.  Last Pain:  Vitals:   02/03/20 0614  TempSrc: Oral  PainSc: 0-No pain      Patients Stated Pain Goal: 2 (02/03/20 3552)  Immediate Anesthesia Transfer of Care Note  Patient: Bonnie Dorsey  Procedure(s) Performed: Procedure(s) (LRB): DILATATION & CURETTAGE/HYSTEROSCOPY WITH  MYOSURE POLYPECTOMY AND MYOMECTOMY (N/A)  Patient Location: PACU  Anesthesia Type: General  Level of Consciousness: awake, alert  and oriented  Airway & Oxygen Therapy: Patient Spontanous Breathing and Patient connected to nasal cannulaoxygen  Post-op Assessment: Report given to PACU RN and Post -op Vital signs reviewed and stable  Post vital signs: Reviewed and stable  Complications: No apparent anesthesia complications

## 2020-02-04 ENCOUNTER — Encounter (HOSPITAL_BASED_OUTPATIENT_CLINIC_OR_DEPARTMENT_OTHER): Payer: Self-pay | Admitting: Obstetrics and Gynecology

## 2020-02-04 LAB — SURGICAL PATHOLOGY

## 2021-01-13 ENCOUNTER — Emergency Department (HOSPITAL_COMMUNITY): Payer: Self-pay

## 2021-01-13 ENCOUNTER — Emergency Department (HOSPITAL_COMMUNITY)
Admission: EM | Admit: 2021-01-13 | Discharge: 2021-01-13 | Disposition: A | Discharge status: Home / Self Care | Payer: Self-pay | Attending: Emergency Medicine | Admitting: Emergency Medicine

## 2021-01-13 ENCOUNTER — Other Ambulatory Visit: Payer: Self-pay

## 2021-01-13 DIAGNOSIS — I10 Essential (primary) hypertension: Secondary | ICD-10-CM | POA: Insufficient documentation

## 2021-01-13 DIAGNOSIS — R030 Elevated blood-pressure reading, without diagnosis of hypertension: Secondary | ICD-10-CM

## 2021-01-13 DIAGNOSIS — R42 Dizziness and giddiness: Secondary | ICD-10-CM

## 2021-01-13 LAB — CBC WITH DIFFERENTIAL/PLATELET
Abs Immature Granulocytes: 0.03 10*3/uL (ref 0.00–0.07)
Basophils Absolute: 0 10*3/uL (ref 0.0–0.1)
Basophils Relative: 0 %
Eosinophils Absolute: 0.3 10*3/uL (ref 0.0–0.5)
Eosinophils Relative: 3 %
HCT: 45 % (ref 36.0–46.0)
Hemoglobin: 15.5 g/dL — ABNORMAL HIGH (ref 12.0–15.0)
Immature Granulocytes: 0 %
Lymphocytes Relative: 18 %
Lymphs Abs: 1.7 10*3/uL (ref 0.7–4.0)
MCH: 29.6 pg (ref 26.0–34.0)
MCHC: 34.4 g/dL (ref 30.0–36.0)
MCV: 85.9 fL (ref 80.0–100.0)
Monocytes Absolute: 0.4 10*3/uL (ref 0.1–1.0)
Monocytes Relative: 4 %
Neutro Abs: 7.1 10*3/uL (ref 1.7–7.7)
Neutrophils Relative %: 75 %
Platelets: 248 10*3/uL (ref 150–400)
RBC: 5.24 MIL/uL — ABNORMAL HIGH (ref 3.87–5.11)
RDW: 12.2 % (ref 11.5–15.5)
WBC: 9.5 10*3/uL (ref 4.0–10.5)
nRBC: 0 % (ref 0.0–0.2)

## 2021-01-13 LAB — BASIC METABOLIC PANEL
Anion gap: 12 (ref 5–15)
BUN: 13 mg/dL (ref 6–20)
CO2: 23 mmol/L (ref 22–32)
Calcium: 9.8 mg/dL (ref 8.9–10.3)
Chloride: 101 mmol/L (ref 98–111)
Creatinine, Ser: 0.67 mg/dL (ref 0.44–1.00)
GFR, Estimated: >60 mL/min (ref >60)
Glucose, Bld: 122 mg/dL — ABNORMAL HIGH (ref 70–99)
Potassium: 3.4 mmol/L — ABNORMAL LOW (ref 3.5–5.1)
Sodium: 136 mmol/L (ref 135–145)

## 2021-01-13 LAB — TROPONIN I (HIGH SENSITIVITY)
Troponin I (High Sensitivity): 7 ng/L (ref <18)
Troponin I (High Sensitivity): 10 ng/L (ref <18)

## 2021-01-13 NOTE — ED Triage Notes (Signed)
Pt here from home for HTN that's been persistent since last Tuesday. Pt states she's been having ongoing headaches, dizziness and feeling that her left arm has been numb since yesterday morning. Pt states that she feels like she's about to black out at times. Pt states that she feels like her heart is beating out her chest, but with no CP at this time.

## 2021-01-13 NOTE — ED Provider Notes (Signed)
Emergency Medicine Provider Triage Evaluation Note  Bonnie Dorsey , a 55 y.o. female  was evaluated in triage.  Pt complains of high blood pressure.  She states that she feels like she is going to pass out.  States that she had some tingling in her left arm earlier, but this has resolved.  Denies any chest pain or SOB.  Review of Systems  Positive: lightheadedness Negative: Chest pain, SOB  Physical Exam  BP (!) 199/124   Pulse (!) 103   Temp 99.5 F (37.5 C) (Oral)   Resp 16   SpO2 97%  Gen:   Awake, no distress   Resp:  Normal effort  MSK:   Moves extremities without difficulty  Other:    Medical Decision Making  Medically screening exam initiated at 1:42 AM.  Appropriate orders placed.  Bonnie Dorsey was informed that the remainder of the evaluation will be completed by another provider, this initial triage assessment does not replace that evaluation, and the importance of remaining in the ED until their evaluation is complete.  Elevated BP   Bonnie Circle, PA-C 01/13/21 0143    Ezequiel Essex, MD 01/13/21 [phone]

## 2021-01-13 NOTE — ED Provider Notes (Signed)
[facility] EMERGENCY DEPARTMENT Provider Note   CSN: [id number] Arrival date & time: 01/13/21  0106     History Chief Complaint  Patient presents with   Hypertension   Headache   Nausea    Bonnie Dorsey is a 55 y.o. female.  55 year old female presents today for evaluation of 1 week duration of elevated blood pressure with associated headache, dizziness.  Patient was evaluated by her PCP yesterday and started on hydrochlorothiazide and prescribed meclizine for dizziness.  She states her dizziness primarily occurs upon waking and she describes it as feeling drunk.  She reports last evening following her visit to the PCP she developed the numbness and redness in the left upper extremity around so she presented to the emergency room for evaluation. She had a transient episode of chest discomfort prior to arrival as well, but without associated dyspnea, diaphoresis, or abdominal pain.  At this time she denied weakness, headache, chest pain, changes in her vision, difficulty with speech.  She did report a sensation she was going to pass out which lasted a few hours.  Currently she is resting in recliner without complaints of chest pain, weakness or numbness, or headache.  The history is provided by the patient. No language interpreter was used.      Past Medical History:  Diagnosis Date   Abnormal uterine bleeding (AUB)    Anemia    Endometrial polyp    Hyperlipidemia     There are no problems to display for this patient.   Past Surgical History:  Procedure Laterality Date   BREAST SURGERY     cyst removal from right breast   DILATATION & CURETTAGE/HYSTEROSCOPY WITH MYOSURE N/A 02/03/2020   Procedure: DILATATION & CURETTAGE/HYSTEROSCOPY WITH  MYOSURE POLYPECTOMY AND MYOMECTOMY;  Surgeon: Joseph Pierini, MD;  Location: [place];  Service: Gynecology;  Laterality: N/A;  request 7:30am in [place] [facility] block requests 30 minutes OR time    RADIOACTIVE SEED GUIDED EXCISIONAL BREAST BIOPSY Right 10/17/2017   Procedure: RADIOACTIVE SEED X'S 2 GUIDED EXCISIONAL RIGHT BREAST BIOPSY;  Surgeon: Rolm Bookbinder, MD;  Location: [place];  Service: General;  Laterality: Right;     OB History     Gravida  2   Para  2   Term  2   Preterm      AB      Living  2      SAB      IAB      Ectopic      Multiple      Live Births              Family History  Problem Relation Age of Onset   Breast cancer Sister     Social History   Tobacco Use   Smoking status: Never   Smokeless tobacco: Never  Vaping Use   Vaping Use: Never used  Substance Use Topics   Alcohol use: No   Drug use: Never    Home Medications Prior to Admission medications   Not on File    Allergies    Apple  Review of Systems   Review of Systems  Constitutional:  Negative for activity change, diaphoresis and fatigue.  HENT:  Negative for tinnitus.   Eyes:  Negative for visual disturbance.  Respiratory:  Negative for shortness of breath.   Cardiovascular:  Negative for chest pain and palpitations.  Gastrointestinal:  Negative for nausea and vomiting.  Musculoskeletal:  Negative for  gait problem.  Neurological:  Positive for dizziness, numbness and headaches. Negative for weakness and light-headedness.  All other systems reviewed and are negative.  Physical Exam Updated Vital Signs BP 128/66   Pulse 87   Temp 99.5 F (37.5 C) (Oral)   Resp 17   Ht 5\' 1"  (1.549 m)   Wt 75.8 kg   SpO2 95%   BMI 31.55 kg/m   Physical Exam Vitals and nursing note reviewed.  Constitutional:      General: She is not in acute distress.    Appearance: Normal appearance. She is not ill-appearing.  HENT:     Head: Normocephalic and atraumatic.     Nose: Nose normal.  Eyes:     General: No visual field deficit or scleral icterus.    Extraocular Movements: Extraocular movements intact.     Conjunctiva/sclera: Conjunctivae  normal.  Cardiovascular:     Rate and Rhythm: Normal rate and regular rhythm.     Pulses: Normal pulses.     Heart sounds: Normal heart sounds.  Pulmonary:     Effort: Pulmonary effort is normal. No respiratory distress.     Breath sounds: Normal breath sounds. No wheezing or rales.  Abdominal:     General: There is no distension.     Tenderness: There is no abdominal tenderness.  Musculoskeletal:        General: Normal range of motion.     Cervical back: Normal range of motion.  Skin:    General: Skin is warm and dry.  Neurological:     General: No focal deficit present.     Mental Status: She is alert. Mental status is at baseline.     GCS: GCS eye subscore is 4. GCS verbal subscore is 5. GCS motor subscore is 6.     Cranial Nerves: Cranial nerves are intact. No cranial nerve deficit, dysarthria or facial asymmetry.     Sensory: Sensation is intact.     Motor: Motor function is intact. No weakness or pronator drift.     Coordination: Coordination is intact.     Gait: Gait is intact. Gait normal.    ED Results / Procedures / Treatments   Labs (all labs ordered are listed, but only abnormal results are displayed) Labs Reviewed  CBC WITH DIFFERENTIAL/PLATELET - Abnormal; Notable for the following components:      Result Value   RBC 5.24 (*)    Hemoglobin 15.5 (*)    All other components within normal limits  BASIC METABOLIC PANEL - Abnormal; Notable for the following components:   Potassium 3.4 (*)    Glucose, Bld 122 (*)    All other components within normal limits  TROPONIN I (HIGH SENSITIVITY)  TROPONIN I (HIGH SENSITIVITY)    EKG EKG Interpretation  Date/Time:  Wednesday January 13 2021 01:38:23 EDT Ventricular Rate:  94 PR Interval:  162 QRS Duration: 96 QT Interval:  392 QTC Calculation: 490 R Axis:   -61 Text Interpretation: Normal sinus rhythm Left anterior fascicular block Inferior infarct , age undetermined Cannot rule out Anterior infarct , age  undetermined Abnormal ECG No previous ECGs available Confirmed by Gerlene Fee [phone]) on 01/13/2021 5:46:52 AM  Radiology CT HEAD WO CONTRAST (5MM)  Result Date: 01/13/2021 CLINICAL DATA:  Dizziness EXAM: CT HEAD WITHOUT CONTRAST TECHNIQUE: Contiguous axial images were obtained from the base of the skull through the vertex without intravenous contrast. COMPARISON:  None. FINDINGS: Brain: No evidence of acute infarction, hemorrhage, hydrocephalus, extra-axial collection or mass  lesion/mass effect. Vascular: No hyperdense vessel or unexpected calcification. Skull: Normal. Negative for fracture or focal lesion. Sinuses/Orbits: The visualized paranasal sinuses are essentially clear. The mastoid air cells are unopacified. Other: None. IMPRESSION: Normal head CT. Electronically Signed   By: Julian Hy M.D.   On: 01/13/2021 03:12    Procedures Procedures   Medications Ordered in ED Medications - No data to display  ED Course  I have reviewed the triage vital signs and the nursing notes.  Pertinent labs & imaging results that were available during my care of the patient were reviewed by me and considered in my medical decision making (see chart for details).    MDM Rules/Calculators/A&P                           CT head normal, BMP unremarkable with the exception of mild hypokalemia 3.4 and glucose 122, CBC unremarkable with the exception of hemoglobin of 15.5.  These findings are noncontributory to her symptoms.  Troponins negative x2.  EKG without acute ischemic changes.  Patient's current blood pressure on exam was 128/66.  She is without her presenting complaints.  Given negative troponin and her EKG I do not suspect ACS.  Her neurological exam is intact and nonfocal. Patient is appropriate for discharge.  Discussed importance of keeping a blood pressure diary and following up with her PCP.  Strict return precautions discussed with patient, and her daughter at bedside.  They voiced  understanding and are in agreement with plan.  Final Clinical Impression(s) / ED Diagnoses Final diagnoses:  Elevated blood pressure reading  Dizziness    Rx / DC Orders ED Discharge Orders     None        Evlyn Courier, PA-C 01/13/21 [id number]    Wyvonnia Dusky, MD 01/13/21 1745

## 2021-01-13 NOTE — Discharge Instructions (Addendum)
Your blood work, CT scan of the head, EKG were all reassuring.  As discussed continue taking your blood pressure medicine.  Keep a blood pressure diary over the next 1 week and follow-up with your primary care doctor.  you can look up vertigo exercises on YouTube. Please return to the emergency room if you develop chest pain, have numbness or weakness on one side of your body, or have speech issues.

## 2021-03-18 ENCOUNTER — Other Ambulatory Visit: Payer: Self-pay

## 2021-03-18 ENCOUNTER — Ambulatory Visit (INDEPENDENT_AMBULATORY_CARE_PROVIDER_SITE_OTHER): Payer: Self-pay | Admitting: Obstetrics & Gynecology

## 2021-03-18 ENCOUNTER — Encounter: Payer: Self-pay | Admitting: Obstetrics & Gynecology

## 2021-03-18 VITALS — BP 128/84 | HR 83

## 2021-03-18 DIAGNOSIS — N6320 Unspecified lump in the left breast, unspecified quadrant: Secondary | ICD-10-CM

## 2021-03-18 DIAGNOSIS — N951 Menopausal and female climacteric states: Secondary | ICD-10-CM

## 2021-03-18 DIAGNOSIS — F419 Anxiety disorder, unspecified: Secondary | ICD-10-CM

## 2021-03-18 NOTE — Progress Notes (Signed)
° ° °  Bonnie Dorsey 20-Jul-1965 [id number]        55 y.o.  G2P2002   RP: Counseling on Perimenopausal anxiety and Left breast pain  HPI: Perimenopausal with LMP 04/2020.  No vaginal bleeding since then.  Reports experiencing anxiety x a few years.  No major depressive Sx, no suicidal ideation.  H/O Left breast benign lump removed when she was 24.  Recent acute left breast pain, no severe pain currently.     OB History  Gravida Para Term Preterm AB Living  2 2 2     2   SAB IAB Ectopic Multiple Live Births               # Outcome Date GA Lbr Len/2nd Weight Sex Delivery Anes PTL Lv  2 Term           1 Term             Past medical history,surgical history, problem list, medications, allergies, family history and social history were all reviewed and documented in the EPIC chart.   Directed ROS with pertinent positives and negatives documented in the history of present illness/assessment and plan.  Exam:  Vitals:   03/18/21 0858  BP: 128/84  Pulse: 83  SpO2: 98%   General appearance:  Normal  Breast exam:  Rt breast and axilla normal.                        Lt breast:  Large mass at 12 O'Clock:  5 x 5 cm, mobile.  Skin normal.  Nodule at 4 O'Clock close to nipple: 2 x 2 cm, mobile.  Axilla negative.   Assessment/Plan:  55 y.o. G2P2002   1. Perimenopause Perimenopausal with LMP 04/2020.  No vaginal bleeding since then.  Reports experiencing anxiety x a few years.  No major depressive Sx, no suicidal ideation.  H/O Left breast benign lump removed when she was 24.  Recent acute left breast pain, no severe pain currently.  Will confirm menopausal status with an [place].  Vit D level.  H/O recent Dx of HTN on Lisinopril.  Recommend no HRT at this time.  Will reconsider per symptoms at Annual Gyn exam 04/2021. - TSH - FSH - Vitamin D (25 hydroxy)  2. Anxiety Reports experiencing anxiety x a few years.  No major depressive Sx, no suicidal ideation.  R/O Thyroid dysfunction with a  TSH. Recommend an increase in fitness activities, yoga and meditation. - TSH  3. Masses of left breast  Lt breast:  Large mass at 12 O'Clock:  5 x 5 cm, mobile.  Skin normal.  Nodule at 4 O'Clock close to nipple: 2 x 2 cm, mobile.  Axilla negative.  Will schedule a Lt Dx mammo/US.  Other orders - lisinopril (ZESTRIL) 10 MG tablet; Take 10 mg by mouth 2 (two) times daily.   Princess Bruins MD, 10:01 AM 03/18/2021

## 2021-03-19 ENCOUNTER — Telehealth: Payer: Self-pay | Admitting: *Deleted

## 2021-03-19 LAB — FOLLICLE STIMULATING HORMONE: FSH: 85.8 m[IU]/mL

## 2021-03-19 LAB — TSH: TSH: 1.2 mIU/L

## 2021-03-19 LAB — VITAMIN D 25 HYDROXY (VIT D DEFICIENCY, FRACTURES): Vit D, 25-Hydroxy: 19 ng/mL — ABNORMAL LOW (ref 30–100)

## 2021-03-19 NOTE — Telephone Encounter (Signed)
Per Dr.Lavoie "Lt breast:  Large mass at 12 O'Clock:  5 x 5 cm, mobile.  Skin normal.  Nodule at 4 O'Clock close to nipple: 2 x 2 cm, mobile.  Axilla negative. "   Last imaging done at [facility] in epic. They are closed today on 03/19/21. I will reach out to schedule next week.

## 2021-03-23 ENCOUNTER — Other Ambulatory Visit: Payer: Self-pay | Admitting: Anesthesiology

## 2021-03-23 DIAGNOSIS — E559 Vitamin D deficiency, unspecified: Secondary | ICD-10-CM

## 2021-03-23 MED ORDER — VITAMIN D (ERGOCALCIFEROL) 1.25 MG (50000 UNIT) PO CAPS: 50000.0000 [IU] | ORAL_CAPSULE | ORAL | 0 refills | Status: AC

## 2021-03-23 NOTE — Telephone Encounter (Signed)
Patient has been informed of appointment time and date at [facility]. (Message copied from [place])

## 2021-03-23 NOTE — Telephone Encounter (Signed)
Will route to [place] to relay to patient in [place].

## 2021-03-23 NOTE — Telephone Encounter (Signed)
Patient scheduled on 04/07/21 @ 8:30am at [facility], order faxed.

## 2021-03-30 ENCOUNTER — Encounter: Payer: Self-pay | Admitting: Obstetrics & Gynecology

## 2021-03-30 ENCOUNTER — Ambulatory Visit (INDEPENDENT_AMBULATORY_CARE_PROVIDER_SITE_OTHER): Payer: Self-pay | Admitting: Obstetrics & Gynecology

## 2021-03-30 ENCOUNTER — Other Ambulatory Visit: Payer: Self-pay

## 2021-03-30 VITALS — BP 110/66 | HR 83 | Temp 100.9°F | Resp 16

## 2021-03-30 DIAGNOSIS — R309 Painful micturition, unspecified: Secondary | ICD-10-CM

## 2021-03-30 MED ORDER — CIPROFLOXACIN HCL 500 MG PO TABS: 500.0000 mg | ORAL_TABLET | Freq: Two times a day (BID) | ORAL | 0 refills | Status: DC

## 2021-03-30 NOTE — Progress Notes (Signed)
° ° °  Bonnie Dorsey [birth year] [id number]        56 y.o.  [id number]   RP: Pain with urination x 3 days  HPI: Pain with urination x 3 days.  No blood seen in urine.  C/O chills last night and mild nausea.  Lower back discomfort.  BMs normal. No cough or sore throat.   OB History  Gravida Para Term Preterm AB Living  2 2 2     2   SAB IAB Ectopic Multiple Live Births               # Outcome Date GA Lbr Len/2nd Weight Sex Delivery Anes PTL Lv  2 Term           1 Term             Past medical history,surgical history, problem list, medications, allergies, family history and social history were all reviewed and documented in the EPIC chart.   Directed ROS with pertinent positives and negatives documented in the history of present illness/assessment and plan.  Exam:  Vitals:   03/30/21 1031  BP: 110/66  Pulse: 83  Resp: 16  Temp: (!) 100.9 F (38.3 C)  TempSrc: Oral   General appearance:  Normal  CVAT:  Negative bilaterally.  Abdomen: Soft, NT.  Gynecologic exam: Deferred.  U/A: Yellow cloudy, Nit Neg, Pro 1+, WBC >60, RBC 3-10, Bacteria Many.  Pending U. Culture.   Assessment/Plan:  56 y.o. G2P2002   1. Pain with urination Acute Cystitis.  Subfebrile, CVAT Negative bilaterally.  Will treat with Ciprofloxacin 500 mg BID x 7 days.  Pending U. Culture.  Push water intake.  Tylenol PRN.  If no improvement at 24-48 hours, needs to be reevaluated to r/o possible Acute Pyelo-Nephritis. - Urinalysis,Complete w/RFL Culture  Other orders - ciprofloxacin (CIPRO) 500 MG tablet; Take 1 tablet (500 mg total) by mouth 2 (two) times daily.   Princess Bruins MD, 10:42 AM 03/30/2021

## 2021-04-02 LAB — URINALYSIS, COMPLETE W/RFL CULTURE
Bilirubin Urine: NEGATIVE
Casts: NONE SEEN /LPF
Crystals: NONE SEEN /HPF
Glucose, UA: NEGATIVE
Hyaline Cast: NONE SEEN /LPF
Nitrites, Initial: NEGATIVE
Specific Gravity, Urine: 1.01 (ref 1.001–1.035)
WBC, UA: >=60 /HPF — AB (ref 0–5)
Yeast: NONE SEEN /HPF
pH: 5.5 (ref 5.0–8.0)

## 2021-04-02 LAB — URINE CULTURE
MICRO NUMBER:: 12820326
SPECIMEN QUALITY:: ADEQUATE

## 2021-04-02 LAB — CULTURE INDICATED

## 2021-04-07 ENCOUNTER — Encounter: Payer: Self-pay | Admitting: Obstetrics & Gynecology

## 2021-04-09 ENCOUNTER — Encounter: Payer: Self-pay | Admitting: Obstetrics & Gynecology

## 2021-04-20 ENCOUNTER — Other Ambulatory Visit: Payer: Self-pay

## 2021-04-20 ENCOUNTER — Ambulatory Visit: Payer: Self-pay | Admitting: *Deleted

## 2021-04-20 ENCOUNTER — Encounter (INDEPENDENT_AMBULATORY_CARE_PROVIDER_SITE_OTHER): Payer: Self-pay

## 2021-04-20 VITALS — BP 126/74 | Wt 151.6 lb

## 2021-04-20 DIAGNOSIS — N6325 Unspecified lump in the left breast, overlapping quadrants: Secondary | ICD-10-CM

## 2021-04-20 DIAGNOSIS — N6323 Unspecified lump in the left breast, lower outer quadrant: Secondary | ICD-10-CM

## 2021-04-20 DIAGNOSIS — Z1211 Encounter for screening for malignant neoplasm of colon: Secondary | ICD-10-CM

## 2021-04-20 DIAGNOSIS — Z1239 Encounter for other screening for malignant neoplasm of breast: Secondary | ICD-10-CM

## 2021-04-20 NOTE — Progress Notes (Signed)
Bonnie Dorsey is a 56 y.o. female who presents to [facility] clinic today with complaint of left breast lump x 2 months. Patient had a diagnostic mammogram and left breast ultrasound completed at [facility] 04/09/2021 that a left breast aspiration with possible conversion to biopsy recommended.   Pap Smear: Pap smear not completed today. Last Pap smear was in 2022 at Dr. Vivia Budge office and was normal per patient. Per patient has no history of an abnormal Pap smear. Last Pap smear result is not available in Epic. Previous Pap smear 11/03/2014 is in [place].   Physical exam: Breasts Breasts symmetrical. No skin abnormalities left breast. 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 No nipple retraction bilateral breasts. No nipple discharge bilateral breasts. No lymphadenopathy. No lumps palpated right breast. Palpated two lumps within the left breast at 12 o'clock 2 cm from the nipple and 4 o'clock next to nipple. No complaints of pain or tenderness on exam.  Pelvic/Bimanual Pap is not indicated today per BCCCP guidelines.   Smoking History: Patient has never smoked.   Patient Navigation: Patient education provided. Access to services provided for patient through [place] program. Spanish interpreter Rudene Anda from [facility] provided.   Colorectal Cancer Screening: Per patient has never had colonoscopy completed. FIT Test given to patient to complete. No complaints today.    Breast and Cervical Cancer Risk Assessment: Patient has a family history of her sister having breast cancer. Patient has no known genetic mutations or history of radiation treatment to the chest before age 19. Patient has no history of cervical dysplasia, immunocompromised, or DES exposure in-utero.  Risk Assessment     Risk Scores       04/20/2021 09/07/2017   Last edited by: Demetrius Revel, LPN Marquis Down, Heath Gold, RN   5-year risk: 1.4 % 1.3 %   Lifetime risk: 9.6 % 10.1 %            A: BCCCP exam without pap smear Complaint of left breast lump.  P: Referred patient to [facility] for a left breast aspiration with possible conversion to biopsy per recommendation. Appointment scheduled Monday, April 26, 2021 at 1345.  Loletta Parish, RN 04/20/2021 10:58 AM

## 2021-04-20 NOTE — Patient Instructions (Signed)
Explained breast self awareness with [name]. Patient did not need a Pap smear today due to last Pap smear was in 2022 per patient. Let her know BCCCP will cover Pap smears every 3 years unless has a history of abnormal Pap smears. Referred patient to [facility] for a left breast aspiration with possible conversion to biopsy per recommendation. Appointment scheduled Monday, April 26, 2021 at 1345. Patient aware of appointment and will be there. Bonnie Dorsey verbalized understanding.  Kashaun Bebo, Arvil Chaco, RN 10:58 AM

## 2021-04-27 ENCOUNTER — Encounter: Payer: Self-pay | Admitting: Obstetrics & Gynecology

## 2021-05-24 ENCOUNTER — Other Ambulatory Visit (HOSPITAL_COMMUNITY)
Admission: RE | Admit: 2021-05-24 | Discharge: 2021-05-24 | Disposition: A | Discharge status: Home / Self Care | Payer: Self-pay | Source: Ambulatory Visit | Attending: Obstetrics & Gynecology | Admitting: Obstetrics & Gynecology

## 2021-05-24 ENCOUNTER — Ambulatory Visit (INDEPENDENT_AMBULATORY_CARE_PROVIDER_SITE_OTHER): Payer: Self-pay | Admitting: Obstetrics & Gynecology

## 2021-05-24 ENCOUNTER — Encounter: Payer: Self-pay | Admitting: Obstetrics & Gynecology

## 2021-05-24 ENCOUNTER — Other Ambulatory Visit: Payer: Self-pay

## 2021-05-24 VITALS — BP 132/88 | HR 73 | Ht 61.75 in | Wt 144.0 lb

## 2021-05-24 DIAGNOSIS — Z01419 Encounter for gynecological examination (general) (routine) without abnormal findings: Secondary | ICD-10-CM

## 2021-05-24 DIAGNOSIS — Z780 Asymptomatic menopausal state: Secondary | ICD-10-CM

## 2021-05-24 DIAGNOSIS — D219 Benign neoplasm of connective and other soft tissue, unspecified: Secondary | ICD-10-CM

## 2021-05-24 NOTE — Progress Notes (Signed)
Bonnie Dorsey 03-03-1966 [id number]   History:    56 y.o. G2P2L2  RP:  Established patient presenting for annual gyn exam   HPI: Postmenopausal, well on no HRT.  No pelvic pain.  No pain with IC.  Pap 2020-2021 Normal per patient.  Breasts normal.  Mammo 03/2021.  Colono 03/2021.  BMI 26.55.  Health labs with Fam MD.  Recently had H/As and dizziness.  Some unintentional weight loss.  Will make an appointment with her Fam MD to investigate.  Past medical history,surgical history, family history and social history were all reviewed and documented in the EPIC chart.  Gynecologic History Patient's last menstrual period was 03/25/2019.  Obstetric History OB History  Gravida Para Term Preterm AB Living  2 2 2     2   SAB IAB Ectopic Multiple Live Births               # Outcome Date GA Lbr Len/2nd Weight Sex Delivery Anes PTL Lv  2 Term           1 Term              ROS: A ROS was performed and pertinent positives and negatives are included in the history.  GENERAL: No fevers or chills. HEENT: No change in vision, no earache, sore throat or sinus congestion. NECK: No pain or stiffness. CARDIOVASCULAR: No chest pain or pressure. No palpitations. PULMONARY: No shortness of breath, cough or wheeze. GASTROINTESTINAL: No abdominal pain, nausea, vomiting or diarrhea, melena or bright red blood per rectum. GENITOURINARY: No urinary frequency, urgency, hesitancy or dysuria. MUSCULOSKELETAL: No joint or muscle pain, no back pain, no recent trauma. DERMATOLOGIC: No rash, no itching, no lesions. ENDOCRINE: No polyuria, polydipsia, no heat or cold intolerance. No recent change in weight. HEMATOLOGICAL: No anemia or easy bruising or bleeding. NEUROLOGIC: No headache, seizures, numbness, tingling or weakness. PSYCHIATRIC: No depression, no loss of interest in normal activity or change in sleep pattern.     Exam:   BP 132/88    Pulse 73    Ht 5' 1.75" (1.568 m)    Wt 144 lb (65.3 kg)    LMP  03/25/2019    SpO2 98%    BMI 26.55 kg/m   Body mass index is 26.55 kg/m.  General appearance : Well developed well nourished female. No acute distress HEENT: Eyes: no retinal hemorrhage or exudates,  Neck supple, trachea midline, no carotid bruits, no thyroidmegaly Lungs: Clear to auscultation, no rhonchi or wheezes, or rib retractions  Heart: Regular rate and rhythm, no murmurs or gallops Breast:Examined in sitting and supine position were symmetrical in appearance, no palpable masses or tenderness,  no skin retraction, no nipple inversion, no nipple discharge, no skin discoloration, no axillary or supraclavicular lymphadenopathy Abdomen: no palpable masses or tenderness, no rebound or guarding Extremities: no edema or skin discoloration or tenderness  Pelvic: Vulva: Normal             Vagina: No gross lesions or discharge  Cervix: No gross lesions or discharge.  Pap reflex done.  Uterus  AV, about 9-10 cm, known Fibroids, non-tender and mobile  Adnexa  Without masses or tenderness  Anus: Normal   Assessment/Plan:  56 y.o. female for annual exam   1. Encounter for routine gynecological examination with Papanicolaou smear of cervix Postmenopausal, well on no HRT.  No pelvic pain.  No pain with IC.  Pap 2020-2021 Normal per patient.  Breasts normal.  Mammo 03/2021.  Colono 03/2021.  BMI 26.55.  Health labs with Fam MD.  Recently had H/As and dizziness.  Some unintentional weight loss.  Will make an appointment with her Fam MD to investigate. - Cytology - PAP( [facility])  2. Postmenopause Postmenopausal, well on no HRT.  No pelvic pain.  No pain with IC.  3. Fibroids  Asymptomatic.  Princess Bruins MD, 11:38 AM 05/24/2021

## 2021-05-26 LAB — CYTOLOGY - PAP: Diagnosis: NEGATIVE

## 2021-06-04 ENCOUNTER — Encounter (HOSPITAL_COMMUNITY): Payer: Self-pay | Admitting: Emergency Medicine

## 2021-06-04 ENCOUNTER — Other Ambulatory Visit: Payer: Self-pay

## 2021-06-04 ENCOUNTER — Emergency Department (HOSPITAL_COMMUNITY)
Admission: EM | Admit: 2021-06-04 | Discharge: 2021-06-04 | Disposition: A | Discharge status: Home / Self Care | Payer: Self-pay | Attending: Emergency Medicine | Admitting: Emergency Medicine

## 2021-06-04 ENCOUNTER — Emergency Department (HOSPITAL_COMMUNITY): Payer: Self-pay

## 2021-06-04 DIAGNOSIS — G44209 Tension-type headache, unspecified, not intractable: Secondary | ICD-10-CM | POA: Insufficient documentation

## 2021-06-04 DIAGNOSIS — Z79899 Other long term (current) drug therapy: Secondary | ICD-10-CM | POA: Insufficient documentation

## 2021-06-04 DIAGNOSIS — R0602 Shortness of breath: Secondary | ICD-10-CM | POA: Insufficient documentation

## 2021-06-04 DIAGNOSIS — I10 Essential (primary) hypertension: Secondary | ICD-10-CM | POA: Insufficient documentation

## 2021-06-04 DIAGNOSIS — R519 Headache, unspecified: Secondary | ICD-10-CM

## 2021-06-04 LAB — CBC WITH DIFFERENTIAL/PLATELET
Abs Immature Granulocytes: 0.01 10*3/uL (ref 0.00–0.07)
Basophils Absolute: 0 10*3/uL (ref 0.0–0.1)
Basophils Relative: 0 %
Eosinophils Absolute: 0.2 10*3/uL (ref 0.0–0.5)
Eosinophils Relative: 4 %
HCT: 40.5 % (ref 36.0–46.0)
Hemoglobin: 13.8 g/dL (ref 12.0–15.0)
Immature Granulocytes: 0 %
Lymphocytes Relative: 24 %
Lymphs Abs: 1.6 10*3/uL (ref 0.7–4.0)
MCH: 29.9 pg (ref 26.0–34.0)
MCHC: 34.1 g/dL (ref 30.0–36.0)
MCV: 87.7 fL (ref 80.0–100.0)
Monocytes Absolute: 0.3 10*3/uL (ref 0.1–1.0)
Monocytes Relative: 4 %
Neutro Abs: 4.6 10*3/uL (ref 1.7–7.7)
Neutrophils Relative %: 68 %
Platelets: 206 10*3/uL (ref 150–400)
RBC: 4.62 MIL/uL (ref 3.87–5.11)
RDW: 12.5 % (ref 11.5–15.5)
WBC: 6.7 10*3/uL (ref 4.0–10.5)
nRBC: 0 % (ref 0.0–0.2)

## 2021-06-04 LAB — BASIC METABOLIC PANEL
Anion gap: 7 (ref 5–15)
BUN: 16 mg/dL (ref 6–20)
CO2: 27 mmol/L (ref 22–32)
Calcium: 9.3 mg/dL (ref 8.9–10.3)
Chloride: 106 mmol/L (ref 98–111)
Creatinine, Ser: 0.69 mg/dL (ref 0.44–1.00)
GFR, Estimated: >60 mL/min (ref >60)
Glucose, Bld: 94 mg/dL (ref 70–99)
Potassium: 3.8 mmol/L (ref 3.5–5.1)
Sodium: 140 mmol/L (ref 135–145)

## 2021-06-04 LAB — TROPONIN I (HIGH SENSITIVITY)
Troponin I (High Sensitivity): 3 ng/L (ref <18)
Troponin I (High Sensitivity): 4 ng/L (ref <18)

## 2021-06-04 NOTE — Discharge Instructions (Signed)
Return to ED with any new symptoms such as chest pain, shortness of breath ?Please follow-up with your PCP sometime this week for ongoing evaluation and management ?Please continue taking your blood pressure medication.  Your blood pressure while here today has been optimally controlled ?

## 2021-06-04 NOTE — ED Triage Notes (Signed)
Patient reports chest pain which started at approximately 0300, and left arm numbness and headache which started around noon today. She reports the chest pain and arm numbness have resolved but still reports a headache. Denies n/v/d. Hx hypertension and hyperlipidemia.  ?

## 2021-06-04 NOTE — ED Provider Notes (Signed)
?[facility] DEPT ?Provider Note ? ? ?CSN: [id number] ?Arrival date & time: 06/04/21  1505 ? ?  ? ?History ? ?Chief Complaint  ?Patient presents with  ? Headache  ? ? ?Bonnie Dorsey is a 56 y.o. female with medical history significant for abnormal uterine bleeding, anemia, hyperlipidemia, hypertension.  Patient states that last night she been having chest pain that resolved.  Patient reports that at 11 AM this morning she began experiencing left arm numbness and "veins in my arm popping out".  Patient continues that she also been experiencing numbness in the back of her head that she describes as lightheadedness.  Patient states that last Friday the same thing happened where she felt numbness in both arms as well as a stabbing pain that she associated with shortness of breath.  Patient states that this episode lasted for about 2 hours and resolved on its own.  Patient currently endorsing lightheadedness, "head numbness".  Patient denies chest pain, shortness of breath, dizziness, weakness, blurred vision, headache.  Patient states that she is compliant on her blood pressure medication.  Patient seen for same complaint in October with largely negative work-up. ? ? ?A language interpreter was used.  ?Headache ?Associated symptoms: numbness   ?Associated symptoms: no dizziness, no nausea, no vomiting and no weakness   ? ?  ? ?Home Medications ?Prior to Admission medications   ?Medication Sig Start Date End Date Taking? Authorizing Provider  ?lisinopril (ZESTRIL) 10 MG tablet Take 10 mg by mouth 2 (two) times daily. 01/15/21   [provider]  ?Vitamin D, Ergocalciferol, (DRISDOL) 1.25 MG (50000 UNIT) CAPS capsule Take 1 capsule (50,000 Units total) by mouth every 7 (seven) days. 03/23/21   Princess Bruins, MD  ?   ? ?Allergies    ?Patient has no active allergies.   ? ?Review of Systems   ?Review of Systems  ?Eyes:  Negative for visual disturbance.  ?Respiratory:  Negative  for shortness of breath.   ?Cardiovascular:  Negative for chest pain.  ?Gastrointestinal:  Negative for nausea and vomiting.  ?Neurological:  Positive for light-headedness and numbness. Negative for dizziness, weakness and headaches.  ?All other systems reviewed and are negative. ? ?Physical Exam ?Updated Vital Signs ?BP 102/67   Pulse 65   Temp 98.5 ?F (36.9 ?C) (Oral)   Resp 18   LMP 03/25/2019   SpO2 100%  ?Physical Exam ?Vitals and nursing note reviewed.  ?Constitutional:   ?   General: She is not in acute distress. ?   Appearance: She is not ill-appearing, toxic-appearing or diaphoretic.  ?HENT:  ?   Head: Normocephalic and atraumatic.  ?   Mouth/Throat:  ?   Mouth: Mucous membranes are moist.  ?Eyes:  ?   Extraocular Movements: Extraocular movements intact.  ?   Pupils: Pupils are equal, round, and reactive to light.  ?Cardiovascular:  ?   Rate and Rhythm: Normal rate and regular rhythm.  ?Pulmonary:  ?   Effort: Pulmonary effort is normal.  ?   Breath sounds: Normal breath sounds. No stridor. No wheezing, rhonchi or rales.  ?Abdominal:  ?   General: Bowel sounds are normal. There is no distension.  ?   Palpations: Abdomen is soft.  ?   Tenderness: There is no abdominal tenderness.  ?Musculoskeletal:  ?   Cervical back: Normal range of motion and neck supple. No rigidity.  ?Skin: ?   General: Skin is warm and dry.  ?   Capillary Refill: Capillary refill  takes less than 2 seconds.  ?Neurological:  ?   Mental Status: She is alert and oriented to person, place, and time.  ?   GCS: GCS eye subscore is 4. GCS verbal subscore is 5. GCS motor subscore is 6.  ?   Cranial Nerves: Cranial nerves 2-12 are intact. No cranial nerve deficit.  ?   Sensory: Sensation is intact. No sensory deficit.  ?   Motor: Motor function is intact. No weakness.  ?   Coordination: Coordination is intact. Coordination normal. Heel to Shin Test normal.  ? ? ?ED Results / Procedures / Treatments   ?Labs ?(all labs ordered are listed, but  only abnormal results are displayed) ?Labs Reviewed  ?CBC WITH DIFFERENTIAL/PLATELET  ?BASIC METABOLIC PANEL  ?TROPONIN I (HIGH SENSITIVITY)  ?TROPONIN I (HIGH SENSITIVITY)  ? ? ?EKG ?EKG Interpretation ? ?Date/Time:  Friday June 04 2021 15:31:12 EST ?Ventricular Rate:  71 ?PR Interval:  167 ?QRS Duration: 100 ?QT Interval:  391 ?QTC Calculation: 425 ?R Axis:   11 ?Text Interpretation: Sinus rhythm Low voltage, precordial leads no? acute ST/T changes Confirmed by Sherwood Gambler [phone]) on 06/04/2021 4:05:35 PM ? ?Radiology ?DG Chest 2 View ? ?Result Date: 06/04/2021 ?CLINICAL DATA:  Chest pain EXAM: CHEST - 2 VIEW COMPARISON:  None. FINDINGS: The heart size and mediastinal contours are within normal limits. Both lungs are clear. No pleural effusion or pneumothorax. The visualized skeletal structures are unremarkable. IMPRESSION: No active cardiopulmonary disease. Electronically Signed   By: Macy Mis M.D.   On: 06/04/2021 16:31   ? ?Procedures ?Procedures  ? ? ?Medications Ordered in ED ?Medications - No data to display ? ?ED Course/ Medical Decision Making/ A&P ?  ?                        ?Medical Decision Making ?Amount and/or Complexity of Data Reviewed ?Labs: ordered. ?Radiology: ordered. ? ? ?55 year old female presents ED for evaluation of hypertension.  Please see HPI for further details. ?On examination, the patient is afebrile, nontachycardic, nonhypoxic, clear lung sounds bilaterally, soft compressible abdomen.  Patient neurological examination does not show any focal neurodeficits.  Patient blood pressure at time of interview was 128/82.  Patient reports being compliant on blood pressure medication every day. ? ?Patient worked up utilizing following labs and imaging studies interpreted by me personally: ?- CBC unremarkable ?- BMP unremarkable ?- Chest x-ray shows no sign of cardiomegaly, mediastinal widening, effusion, consolidation ?- EKG sinus rhythm ?- Initial troponin result 3.  Delta troponin 1.  Second troponin is 4.  ? ?Patient treated utilizing following medications: None ? ?At this time, the patient seems stable for discharge.  Patient discussed with my attending Dr. Regenia Skeeter who is in agreement with the plan to discharge.  Patient provided with return precautions and she voiced understanding.  Questions answered to her satisfaction.  Patient advised to follow-up with her PCP for any ongoing management or needs.  Patient stable at discharge. ? ? ?Final Clinical Impression(s) / ED Diagnoses ?Final diagnoses:  ?Acute nonintractable headache, unspecified headache type  ? ? ?Rx / DC Orders ?ED Discharge Orders   ? ? None  ? ?  ? ? ?  ?Azucena Cecil, PA-C ?06/04/21 1855 ? ?  ?Sherwood Gambler, MD ?06/04/21 2227 ? ?

## 2021-12-31 ENCOUNTER — Other Ambulatory Visit: Payer: Self-pay

## 2021-12-31 ENCOUNTER — Encounter (HOSPITAL_COMMUNITY): Payer: Self-pay

## 2021-12-31 ENCOUNTER — Emergency Department (HOSPITAL_COMMUNITY): Payer: Self-pay

## 2021-12-31 ENCOUNTER — Emergency Department (HOSPITAL_COMMUNITY)
Admission: EM | Admit: 2021-12-31 | Discharge: 2021-12-31 | Disposition: A | Discharge status: Home / Self Care | Payer: Self-pay | Attending: Emergency Medicine | Admitting: Emergency Medicine

## 2021-12-31 DIAGNOSIS — Z79899 Other long term (current) drug therapy: Secondary | ICD-10-CM | POA: Insufficient documentation

## 2021-12-31 DIAGNOSIS — R202 Paresthesia of skin: Secondary | ICD-10-CM

## 2021-12-31 DIAGNOSIS — M5481 Occipital neuralgia: Secondary | ICD-10-CM | POA: Insufficient documentation

## 2021-12-31 DIAGNOSIS — I10 Essential (primary) hypertension: Secondary | ICD-10-CM | POA: Insufficient documentation

## 2021-12-31 NOTE — ED Provider Notes (Signed)
[facility] DEPT Provider Note   CSN: [id number] Arrival date & time: 12/31/21  5366     History  Chief Complaint  Patient presents with   Headache   Numbness    Bonnie Dorsey is a 56 y.o. female.  HPI    56 year old female comes in with chief complaint of headache and numbness.  Patient indicates that she been having intermittent episodes of headache for the last 2 months.  She is also having numbness type feeling over the occipital region.  Her symptoms were worse yesterday, described as feeling like her brain was throbbing, prompting her to come to the ER.  Patient denies any slurred speech, vertiginous symptoms, difficulty swallowing, one-sided weakness, numbness.  She had an episode of dizziness few weeks back.  She also states that she has experienced constant " Zoom" noise in her left ear.  No tinnitus.  No drainage.  No ear pain.  Patient is undergoing menopause right now.  She has history of hypertension.  Denies any tobacco use disorder, alcohol use disorder or substance use disorder.  She also denies any family history of brain aneurysm.  Patient came to the ER because she wanted to make sure that she was getting enough oxygen to her brain.  Daughter at the bedside provides translation help.  Patient and the daughter were comfortable with the translation and did not request translation services.  Home Medications Prior to Admission medications   Medication Sig Start Date End Date Taking? Authorizing Provider  lisinopril (ZESTRIL) 10 MG tablet Take 10 mg by mouth 2 (two) times daily. 01/15/21   [provider]  Vitamin D, Ergocalciferol, (DRISDOL) 1.25 MG (50000 UNIT) CAPS capsule Take 1 capsule (50,000 Units total) by mouth every 7 (seven) days. 03/23/21   Princess Bruins, MD      Allergies    Patient has no known allergies.    Review of Systems   Review of Systems  All other systems reviewed and are  negative.   Physical Exam Updated Vital Signs BP (!) 132/96 (BP Location: Right Arm)   Pulse 81   Temp 99.2 F (37.3 C) (Oral)   Resp 18   Ht '5\' 2"'$  (1.575 m)   Wt 65.3 kg   LMP 03/25/2019   SpO2 99%   BMI 26.34 kg/m  Physical Exam Vitals and nursing note reviewed.  Constitutional:      Appearance: She is well-developed.  HENT:     Head: Atraumatic.     Comments: Patient has normal TM bilaterally Eyes:     General: No visual field deficit.    Extraocular Movements: Extraocular movements intact.     Pupils: Pupils are equal, round, and reactive to light.  Cardiovascular:     Rate and Rhythm: Normal rate.  Pulmonary:     Effort: Pulmonary effort is normal.  Musculoskeletal:     Cervical back: Normal range of motion and neck supple.  Skin:    General: Skin is warm and dry.  Neurological:     Mental Status: She is alert and oriented to person, place, and time.     GCS: GCS eye subscore is 4. GCS verbal subscore is 5. GCS motor subscore is 6.     Cranial Nerves: No cranial nerve deficit, dysarthria or facial asymmetry.     Sensory: No sensory deficit.     ED Results / Procedures / Treatments   Labs (all labs ordered are listed, but only abnormal results are displayed) Labs  Reviewed - No data to display  EKG None  Radiology No results found.  Procedures Procedures    Medications Ordered in ED Medications - No data to display  ED Course/ Medical Decision Making/ A&P                           Medical Decision Making Amount and/or Complexity of Data Reviewed Radiology: ordered.   Pt comes in with cc of numbness to her occipital region, some pain, intermittent episodes. Hx of HTN. Reassuring family hx. There is no SUD.  Differential diagnosis includes - occipital neuralgia, migraine, trigeminal neuralgia. Doubt cervical stenosis. Doubt aneurysm.   Pt wants to make sure her brain is OK. CT head ordered. Outpatient Neuro follow up provided.   Final  Clinical Impression(s) / ED Diagnoses Final diagnoses:  Bilateral occipital neuralgia    Rx / DC Orders ED Discharge Orders          Ordered    Ambulatory referral to Neurology       Comments: An appointment is requested in approximately: 2 weeks   12/31/21 1101              Varney Biles, MD 01/05/22 0015

## 2021-12-31 NOTE — Discharge Instructions (Addendum)
I suspect that you have occipital neuralgia, but recommend following up with Neurology for further diagnostic and management work.

## 2021-12-31 NOTE — ED Triage Notes (Addendum)
Patient c/o intermittent headache and numbness of the head x 2 months, Worse yesterday. Patient also c/o intermittent dizziness with the headache.  Patient denies blurred vision, light sensitivity, or N/V.

## 2022-07-31 IMAGING — CT CT HEAD W/O CM
3 series · 14 of 47 positions shown, 16 images · non-contrast
Comparison: None.

CLINICAL DATA: Dizziness

EXAM:
CT HEAD WITHOUT CONTRAST
TECHNIQUE: Contiguous axial images were obtained from the base of the skull
through the vertex without intravenous contrast.

[Series 4: head 5.0 h30s · axial · 0.46mm/px · z∈[-109,+26]mm · 8 of 33 slices shown, 10 images]
[im 3/33  brain]
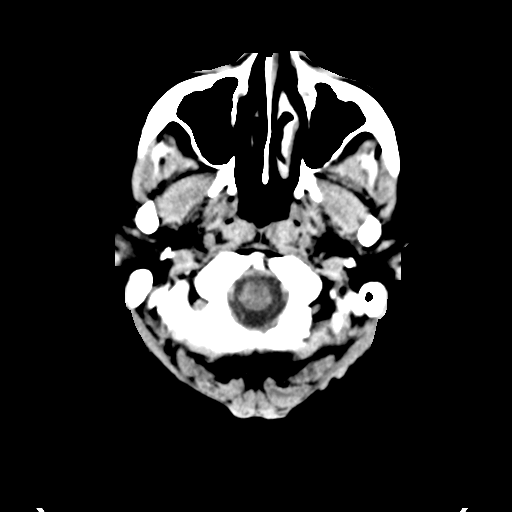
[im 3/33  bone]
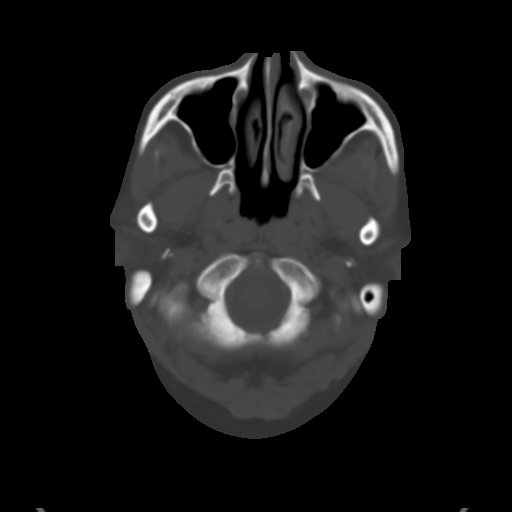
[im 7/33  brain]
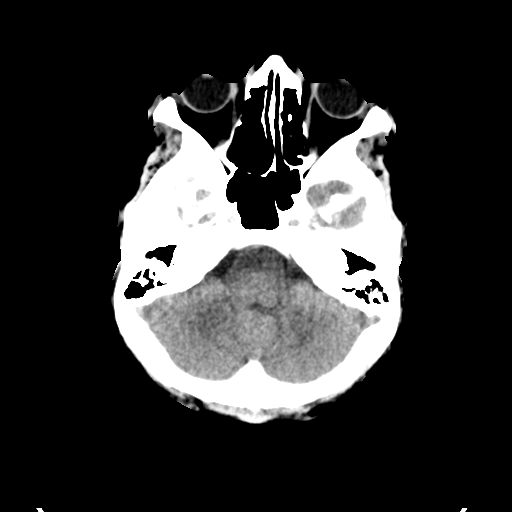
[im 10/33  brain]
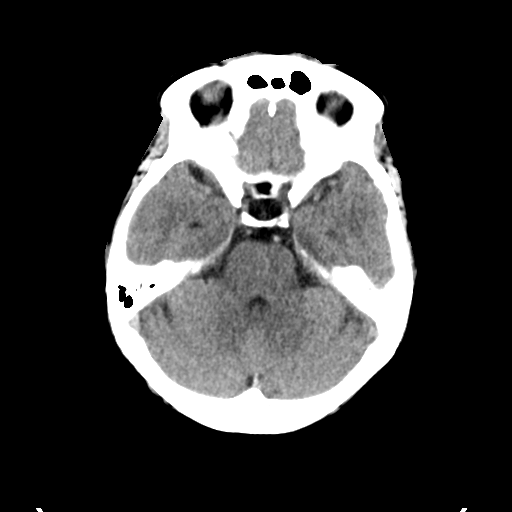
[im 15/33  brain]
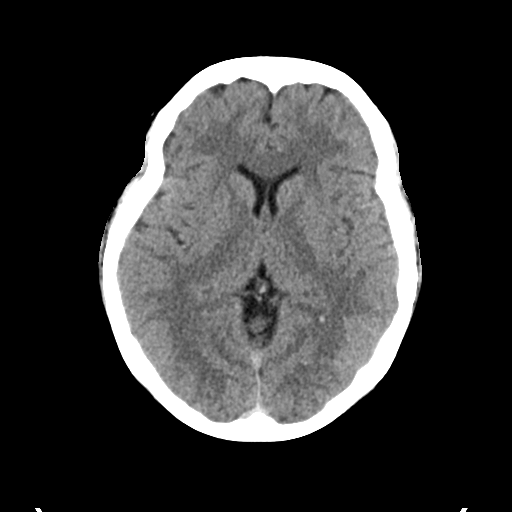
[im 18/33  brain]
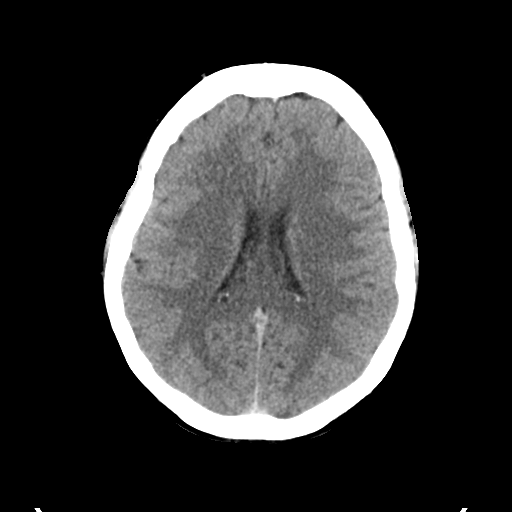
[im 18/33  bone]
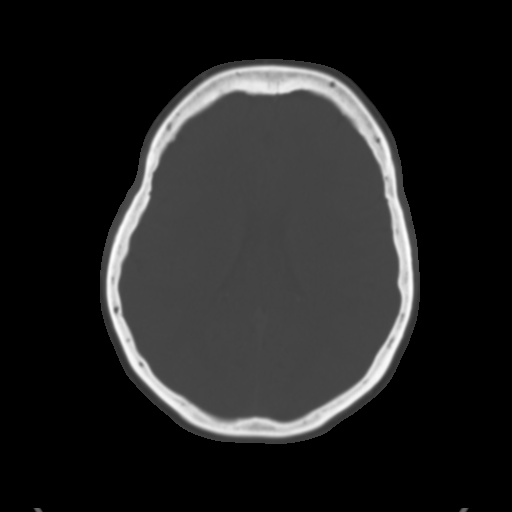
[im 23/33  brain]
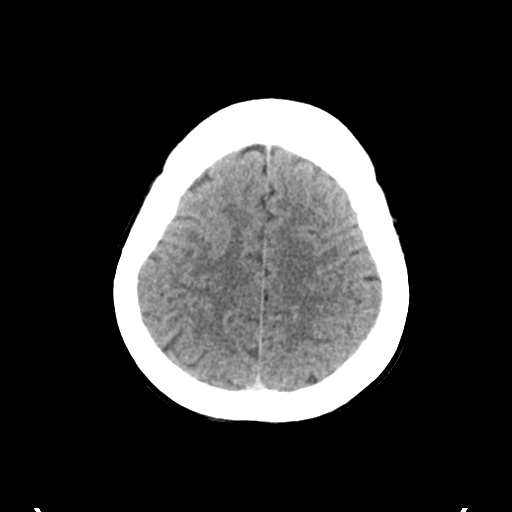
[im 26/33  brain]
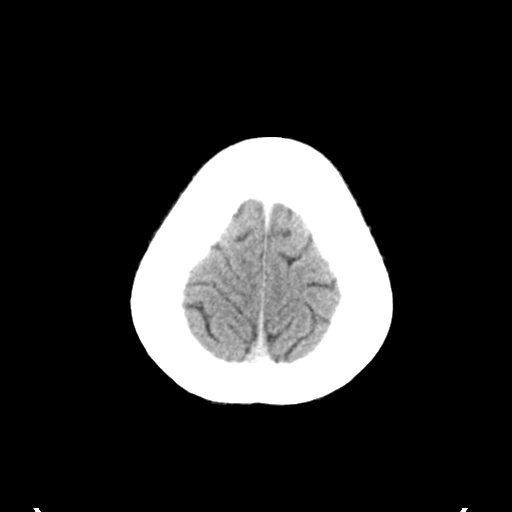
[im 30/33  brain]
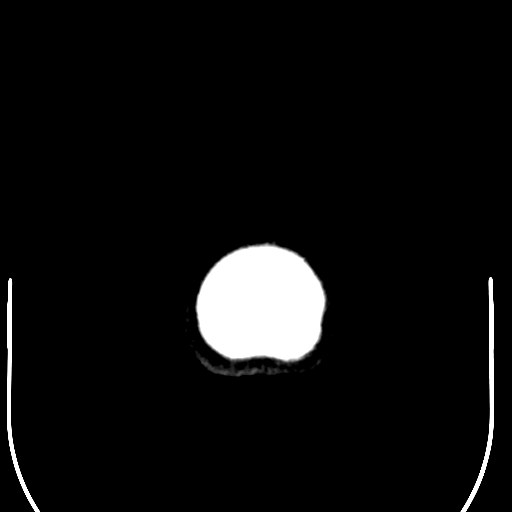

[Series 5: head 3.0 mpr cor · coronal · 0.31mm/px · 3 of 65 slices shown]
[im 22/65  brain]
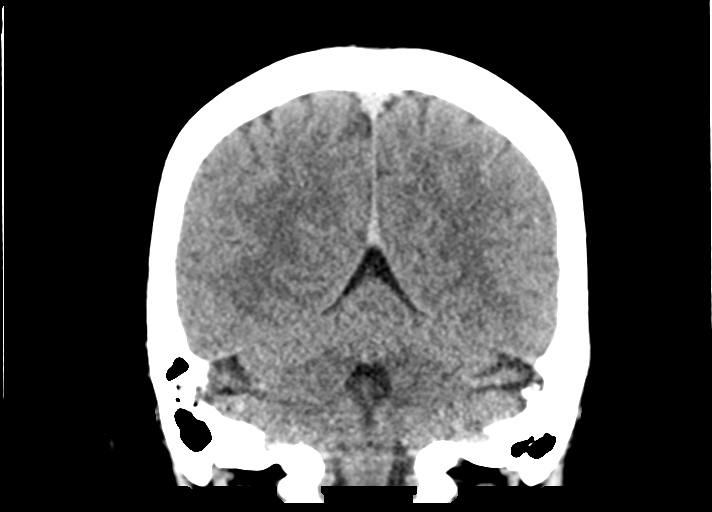
[im 29/65  brain]
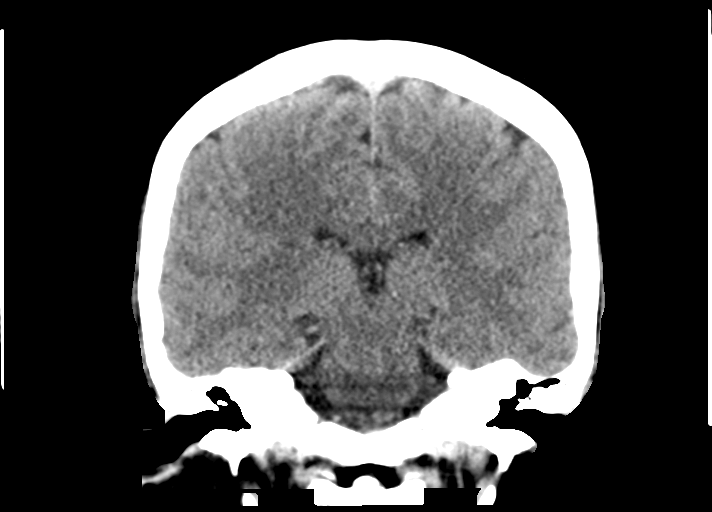
[im 36/65  brain]
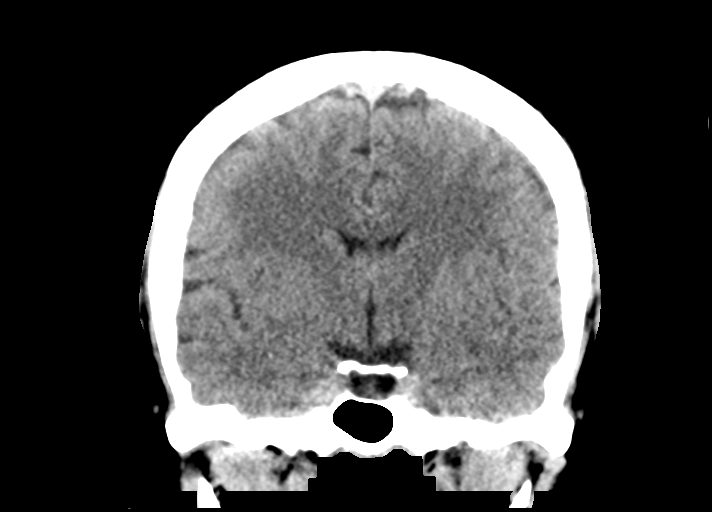

[Series 6: head 3.0 mpr sag · sagittal · 0.31mm/px · 3 of 53 slices shown]
[im 18/53  brain]
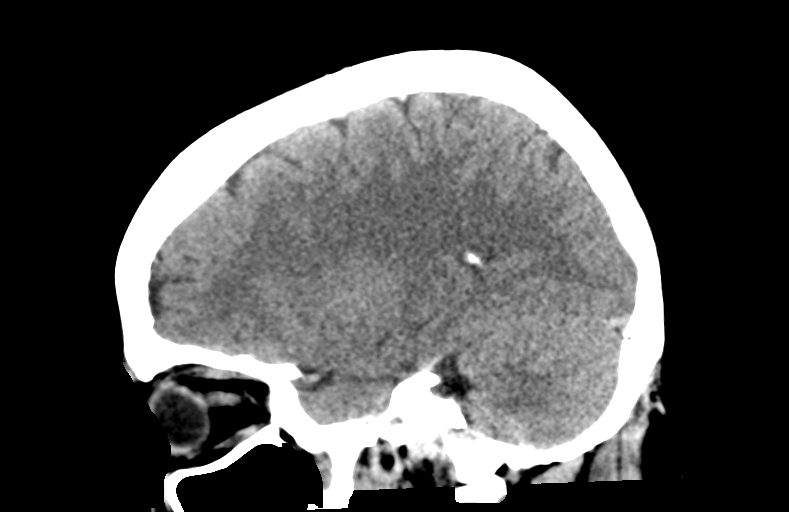
[im 27/53  brain]
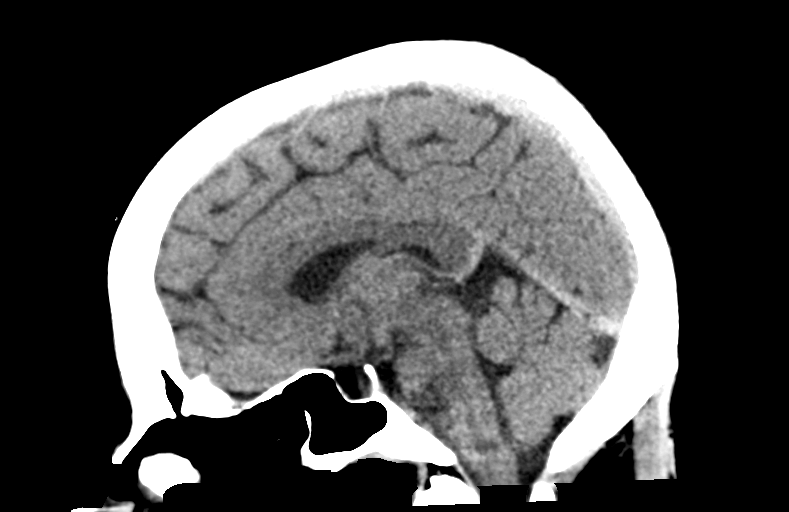
[im 35/53  brain]
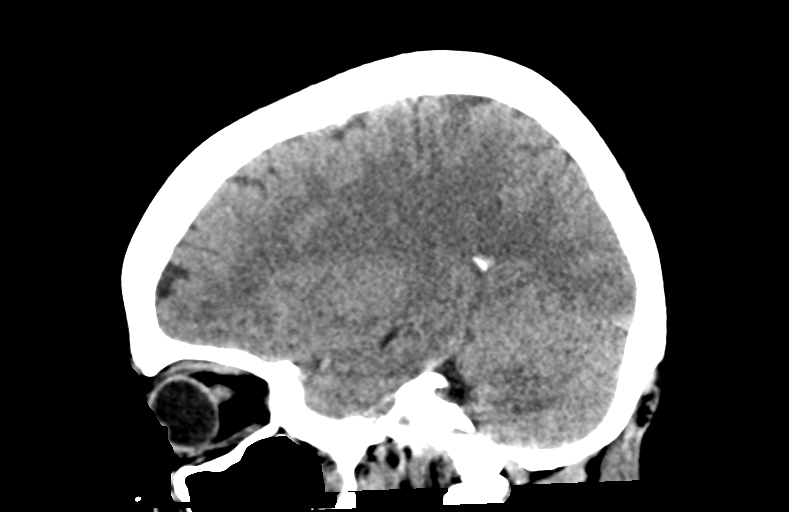

[14 of 47 positions shown; findings below may reference images not displayed]

FINDINGS: Brain: No evidence of acute infarction, hemorrhage, hydrocephalus,
extra-axial collection or mass lesion/mass effect.

Vascular: No hyperdense vessel or unexpected calcification.

Skull: Normal. Negative for fracture or focal lesion.

Sinuses/Orbits: The visualized paranasal sinuses are essentially
clear. The mastoid air cells are unopacified.

Other: None.
IMPRESSION: Normal head CT.

## 2022-12-20 IMAGING — DX DG CHEST 2V
2 series · 2 of 2 positions shown · non-contrast
Comparison: None.

CLINICAL DATA: Chest pain

EXAM:
CHEST - 2 VIEW

[chest pa]
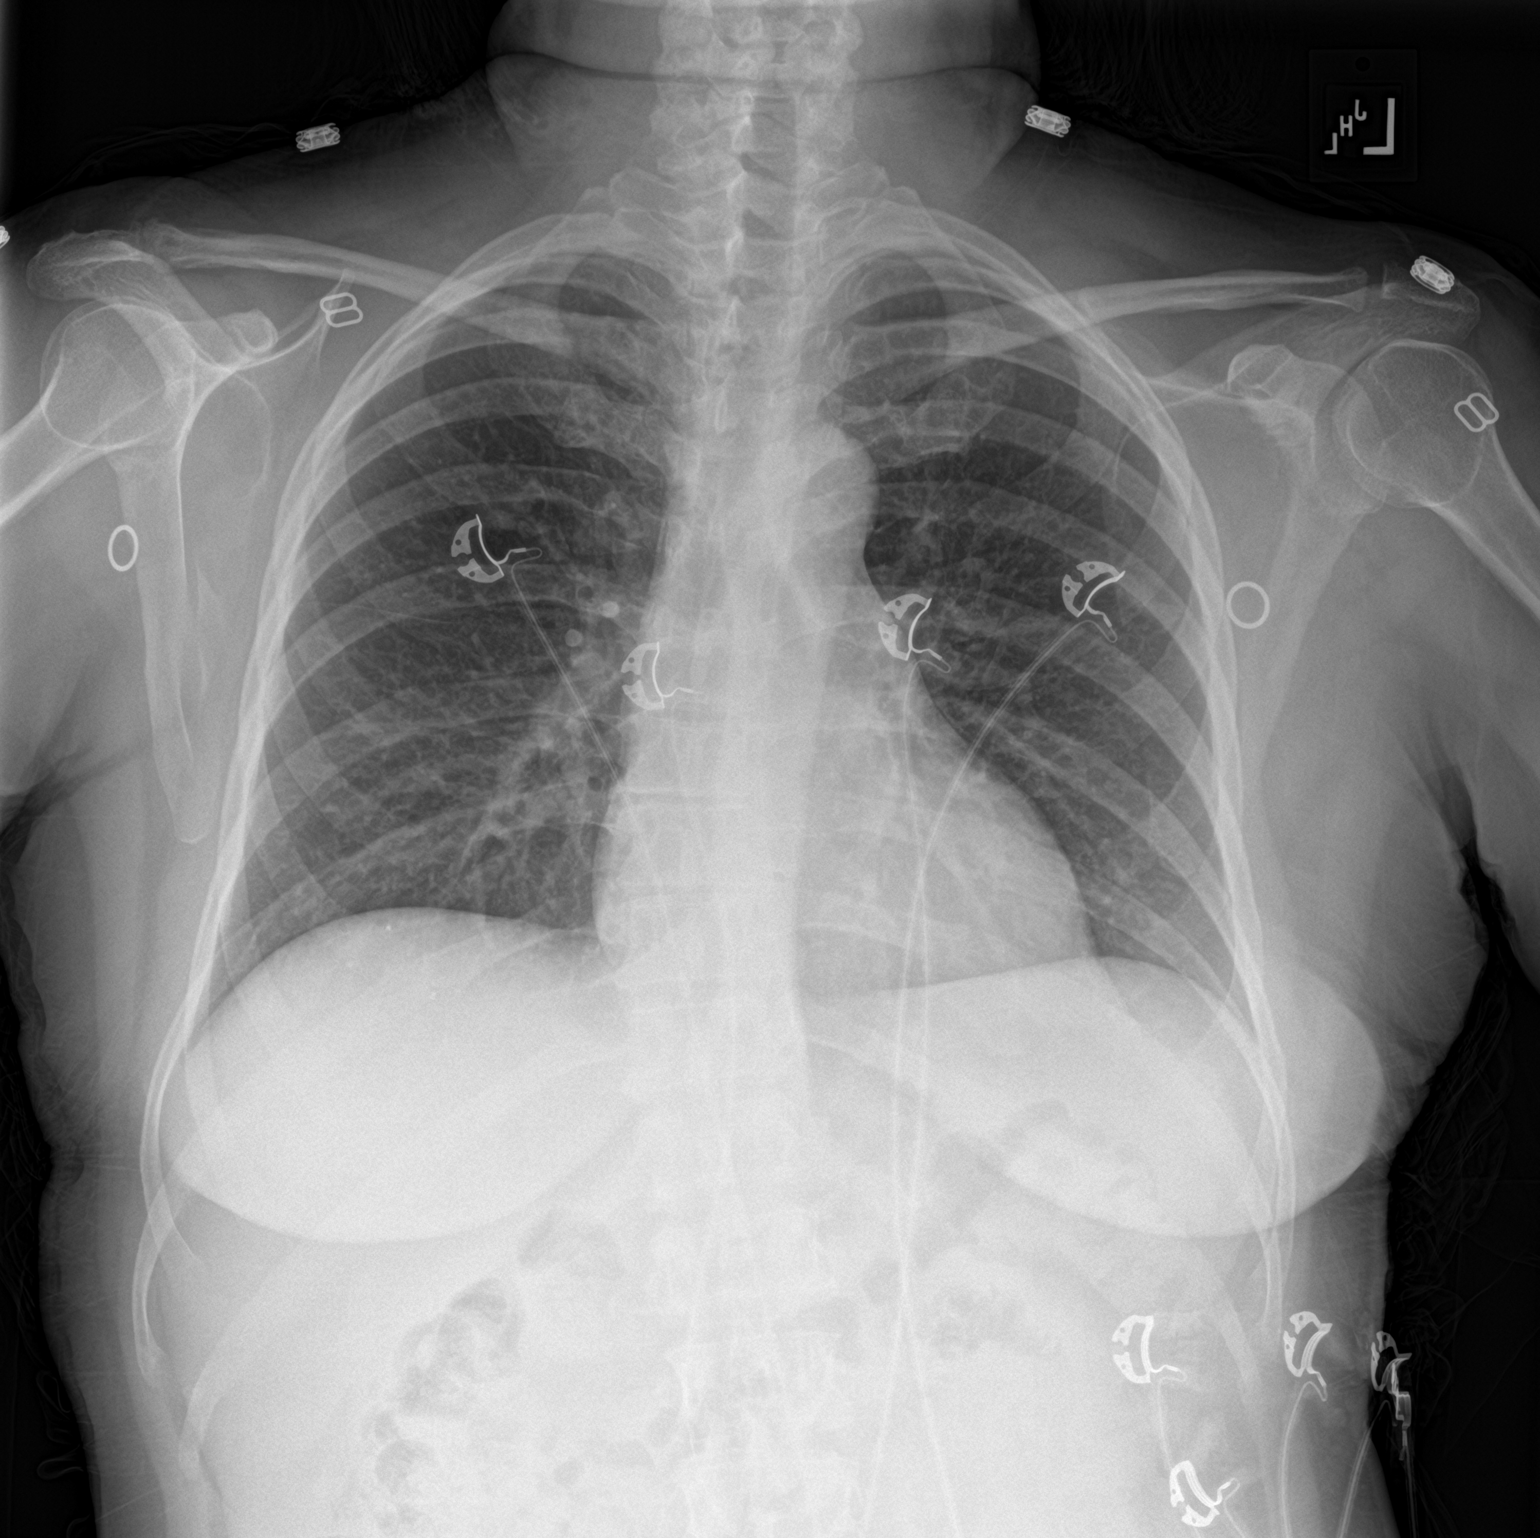

[chest lat]
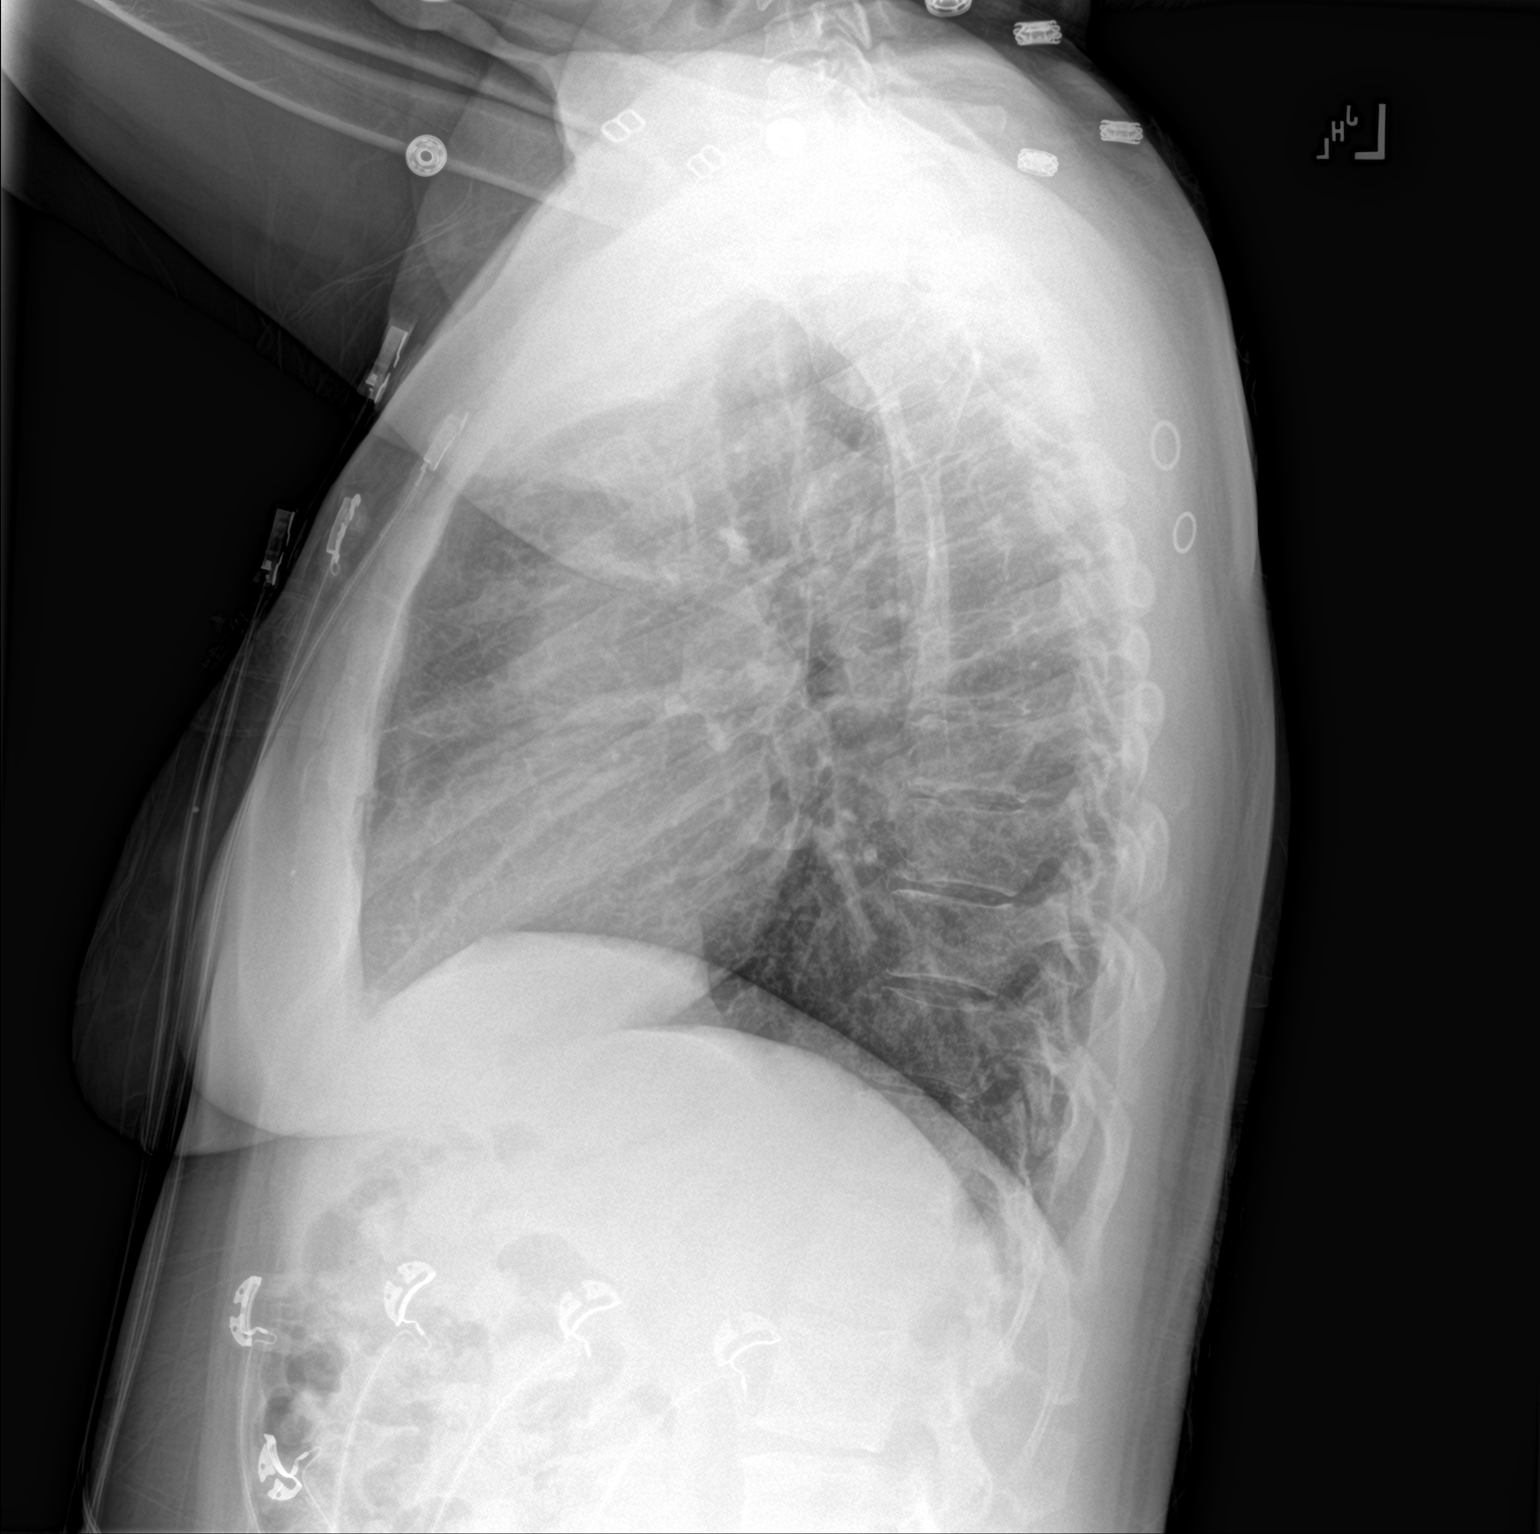

[2 of 2 positions shown; findings below may reference images not displayed]

FINDINGS: The heart size and mediastinal contours are within normal limits.
Both lungs are clear. No pleural effusion or pneumothorax. The
visualized skeletal structures are unremarkable.
IMPRESSION: No active cardiopulmonary disease.

## 2024-01-03 ENCOUNTER — Telehealth: Payer: Self-pay

## 2024-01-03 NOTE — Telephone Encounter (Signed)
 Telephoned patient at daughter's telephone number. Number out of service at this time. [facility] (referral)

## 2024-01-11 ENCOUNTER — Ambulatory Visit: Payer: Self-pay | Admitting: *Deleted

## 2024-01-11 VITALS — BP 141/99 | Wt 160.0 lb

## 2024-01-11 DIAGNOSIS — Z1239 Encounter for other screening for malignant neoplasm of breast: Secondary | ICD-10-CM

## 2024-01-11 DIAGNOSIS — Z1211 Encounter for screening for malignant neoplasm of colon: Secondary | ICD-10-CM

## 2024-01-11 NOTE — Patient Instructions (Signed)
 Explained breast self awareness with Bonnie Dorsey. Patient did not need a Pap smear today due to last Pap smear was 05/24/2021. Let her know [facility] will cover Pap smears every 3 years unless has a history of abnormal Pap smears. Referred patient to [facility] for a left breast ultrasound with possible conversion to biopsy per recommendation. Appointment scheduled Monday, January 15, 2024 at 1245. Patient aware of appointment and will be there. Bonnie Dorsey verbalized understanding.  Bonnie Dorsey, Bonnie Ship, RN 12:54 PM

## 2024-01-11 NOTE — Progress Notes (Signed)
 Ms. Bonnie Dorsey is a 58 y.o. female who presents to [facility] clinic today with no complaints. Patient referred to [facility] due to having a mammogram completed 12/28/2023 that a left breast ultrasound and biopsy is recommended for follow up.   Pap Smear: Pap smear not completed today. Last Pap smear was 05/24/2021 at [facility] [place] clinic and was normal. Per patient has no history of an abnormal Pap smear. Last Pap smear result is available in Epic.   Physical exam: Breasts Breasts symmetrical. No skin abnormalities left breast. 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 No nipple retraction bilateral breasts. No nipple discharge bilateral breasts. No lymphadenopathy. No lumps palpated bilateral breasts. No complaints of pain or tenderness on exam.   Pelvic/Bimanual Pap is not indicated today per [facility] guidelines.   Smoking History: Patient has never smoked.  Patient Navigation: Patient education provided. Access to services provided for patient through [place] program. Spanish interpreter Bernice Angry from [facility] provided.   Colorectal Cancer Screening: Per patient has never had colonoscopy completed. FIT Test given to patient to complete. No complaints today.    Breast and Cervical Cancer Risk Assessment: Patient has a family history of her sister having breast cancer. Patient has a history of Atypical Hyperplasia. Patient has no known genetic mutations or history of radiation treatment to the chest before age 35. Patient has no history of cervical dysplasia, immunocompromised, or DES exposure in-utero.   Risk Scores as of Encounter on 01/11/2024     Alisa           5-year 3.83%   Lifetime 20.5%            Last calculated by Silas, Ansyi K, CMA on 01/11/2024 at 12:49 PM        A: [facility] exam without pap smear No complaints.  P: Referred  patient to [facility] for a left breast ultrasound with possible conversion to biopsy per recommendation. Appointment scheduled Monday, January 15, 2024 at 1245.   Driscilla Wanda SQUIBB, RN 01/11/2024 12:54 PM

## 2024-01-15 ENCOUNTER — Other Ambulatory Visit: Payer: Self-pay | Admitting: Radiology

## 2024-01-16 ENCOUNTER — Encounter: Payer: Self-pay | Admitting: Obstetrics and Gynecology

## 2024-01-16 LAB — SURGICAL PATHOLOGY

## 2024-01-18 ENCOUNTER — Encounter: Payer: Self-pay | Admitting: Obstetrics and Gynecology

## 2024-04-03 ENCOUNTER — Telehealth: Payer: Self-pay

## 2024-04-03 NOTE — Telephone Encounter (Signed)
 Using [address], [place], [place]: [id number], I have left this pt a detailed message inviting her to have her cervical screening (PAP) done on May 30, 2024. I requested pt call back to [facility] at [phone] to schedule this appt.
